# Patient Record
Sex: Male | Born: 1955 | Race: White | Hispanic: No | Marital: Married | Smoking: Former smoker
Health system: Southern US, Community
[De-identification: ages and names within clinical notes are randomized; demographics above are authoritative.]

## PROBLEM LIST (undated history)

## (undated) DIAGNOSIS — N529 Male erectile dysfunction, unspecified: Secondary | ICD-10-CM

## (undated) DIAGNOSIS — I10 Essential (primary) hypertension: Secondary | ICD-10-CM

## (undated) DIAGNOSIS — E785 Hyperlipidemia, unspecified: Secondary | ICD-10-CM

## (undated) DIAGNOSIS — I2510 Atherosclerotic heart disease of native coronary artery without angina pectoris: Secondary | ICD-10-CM

## (undated) HISTORY — DX: Atherosclerotic heart disease of native coronary artery without angina pectoris: I25.10

## (undated) HISTORY — DX: Male erectile dysfunction, unspecified: N52.9

## (undated) HISTORY — DX: Essential (primary) hypertension: I10

## (undated) HISTORY — PX: FINGER AMPUTATION: SHX636

## (undated) HISTORY — DX: Hyperlipidemia, unspecified: E78.5

---

## 2002-01-26 HISTORY — PX: CARDIAC CATHETERIZATION: SHX172

## 2002-02-23 ENCOUNTER — Inpatient Hospital Stay (HOSPITAL_COMMUNITY): Admission: AD | Admit: 2002-02-23 | Discharge: 2002-02-26 | Payer: Self-pay

## 2005-07-14 ENCOUNTER — Encounter: Admission: RE | Admit: 2005-07-14 | Discharge: 2005-07-14 | Payer: Self-pay | Admitting: Family Medicine

## 2005-12-26 HISTORY — PX: NM MYOCAR PERF WALL MOTION: HXRAD629

## 2008-06-13 ENCOUNTER — Encounter: Admission: RE | Admit: 2008-06-13 | Discharge: 2008-06-13 | Payer: Self-pay | Admitting: Cardiology

## 2008-06-21 ENCOUNTER — Observation Stay (HOSPITAL_COMMUNITY): Admission: RE | Admit: 2008-06-21 | Discharge: 2008-06-22 | Payer: Self-pay | Admitting: Cardiology

## 2008-06-21 HISTORY — PX: CARDIAC CATHETERIZATION: SHX172

## 2010-05-06 LAB — CARDIAC PANEL(CRET KIN+CKTOT+MB+TROPI)
CK, MB: 21.3 ng/mL — ABNORMAL HIGH (ref 0.3–4.0)
Relative Index: 13.1 — ABNORMAL HIGH (ref 0.0–2.5)
Total CK: 141 U/L (ref 7–232)
Total CK: 162 U/L (ref 7–232)
Troponin I: 1.38 ng/mL (ref 0.00–0.06)
Troponin I: 1.71 ng/mL (ref 0.00–0.06)

## 2010-05-06 LAB — CBC
HCT: 43.9 % (ref 39.0–52.0)
Hemoglobin: 15.1 g/dL (ref 13.0–17.0)
MCHC: 34.4 g/dL (ref 30.0–36.0)
MCV: 86.1 fL (ref 78.0–100.0)
Platelets: 268 10*3/uL (ref 150–400)
RBC: 5.1 MIL/uL (ref 4.22–5.81)
RDW: 13 % (ref 11.5–15.5)
WBC: 9.4 10*3/uL (ref 4.0–10.5)

## 2010-05-06 LAB — BASIC METABOLIC PANEL
BUN: 11 mg/dL (ref 6–23)
CO2: 29 mEq/L (ref 19–32)
Calcium: 8.9 mg/dL (ref 8.4–10.5)
Chloride: 107 mEq/L (ref 96–112)
Creatinine, Ser: 0.94 mg/dL (ref 0.4–1.5)
GFR calc Af Amer: >60 mL/min (ref >60)
GFR calc non Af Amer: >60 mL/min (ref >60)
Glucose, Bld: 116 mg/dL — ABNORMAL HIGH (ref 70–99)
Potassium: 4.2 mEq/L (ref 3.5–5.1)
Sodium: 139 mEq/L (ref 135–145)

## 2010-06-10 NOTE — Cardiovascular Report (Signed)
Justin Dennis, Justin Dennis                  ACCOUNT NO.:  [id number]   MEDICAL RECORD NO.:  [id number]          PATIENT TYPE:  INP   LOCATION:  2507                         FACILITY:  [facility]   PHYSICIAN:  [name], M.D. DATE OF BIRTH:  May 17, 1955   DATE OF PROCEDURE:  06/21/2008  DATE OF DISCHARGE:                            CARDIAC CATHETERIZATION   INDICATIONS:  This 55 year old male presented with increasing symptoms  of dyspnea on exertion and some vague chest and upper back pain.  He had  had a stent placed to his circumflex in 2004.  It was a 3.0 x 18 Cypher  stent.  Because of his symptoms, a decision was made to bring him to the  Cath Lab for an outpatient cardiac catheterization.   After obtaining informed consent, the patient was prepped and draped in  the usual sterile fashion exposing the right groin.  Following local  anesthetic with 1% Xylocaine, the Seldinger technique was deployed and a  5-French introducer sheath was placed in the right femoral artery.  Left  and right coronary arteriography and ventriculography was performed.  Following this intervention to the RCA and then intervention to the  circumflex was performed.   COMPLICATIONS:  None.   EQUIPMENT:  The diagnostic catheters were 5-French Judkins  configurations.   Interventional equipment as listed below.   TOTAL CONTRAST:  300 mL   MEDICATIONS:  The patient was given 4 baby aspirins prior to starting  the cath, 600 mg of oral Plavix after the procedure.  He was given IV  Angiomax during the procedure and had an ACT of 296.   RESULTS:  1. Hemodynamic monitoring.  His central aortic pressure was 135/86.      His left ventricular pressure was 132/10 and there was no aortic      valve gradient at the time of pullback.  2. Ventriculography.  Ventriculography in the RAO projection using 20      mL of contrast at 12 mL per second revealed an ejection fraction of      55%, no wall motion abnormalities and  end-diastolic pressure of 15.   CORONARY ARTERIOGRAPHY:  1. Left main normal and bifurcated.  2. Circumflex.  There was a stent in the proximal portion of the      circumflex with an area of 70% hyperlucent in the body of the      stent.  The distal circumflex and the OMs were free of disease.  3. LAD.  The LAD crossed the apex of the heart that had minimal      proximal irregularities.  There was a moderate size first diagonal      that was free of disease.   Right coronary artery.  There was sequential areas of 80 and 70%  narrowing in the proximal right coronary artery.  The mid distal PDA and  two posterior lateral vessels were all free of disease.   Because of the above-mentioned stenoses, the decision was made to  proceed on with intervention.  The 5-French system was upgraded to a 6-  [place] system.  The first intervention involved the right coronary  artery, a JR-4 with side hole guide catheter and a Prowater wire was  used.  The guide catheter was difficult to cannulate the right coronary  artery.  Once it cannulated it deep throated itself almost to the  proximal lesion.  There were several occasions where we had to  reposition the guide catheter and in between stent deployment and post  dilatation I lost the entire system and had to re-engage the ostium and  then had to prolapse my wire into the distal right coronary artery.  This was actually accomplished with no adverse sequelae.   Once the Prowater wire was placed in the distal right coronary artery  and the ACT from the Angiomax was greater than 200.  A 2.0 x 12 apex  balloon was used to dilate the 80% most proximal lesion.  After single  inflation of 10 atmospheres, the area was now less than 50% narrowed.   A 3.0 x 18 Cypher stent was then made ready and placed in the distal  area of obstruction.  This extended back towards the proximal area.  The  stent was deployed at 14 atmospheres for 42 seconds with a final   inflation being 14 atmospheres for 30 seconds.   The next stent used was an Xience 3.0 x 15 stent.  This was placed in  such a manner that it overlapped the Cypher stent and covered the  proximal area quite well.  The initial deployment was 14 atmospheres for  40 seconds and the final inflation was 14 atmospheres for 40 seconds.   At this point, we lost guide backup and wire position.  We were able to  finally recannulated and prolapse the wire down to the distal right.   Post dilatation was accomplished with an Nash Quantum balloon 3.25 x 20.  A total of three inflations throughout the entire stented area but also  making sure that the overlap was well covered.  15 atmospheres for 40  seconds was a most aggressive inflation.  Following the intervention,  there was no evidence of any dissection or thrombus formation.  The  vessel appeared to be normal.  There was brisk TIMI III flow.  The  patient did complain of some intermittent chest discomfort with the  inflation and he was given intracoronary nitroglycerin.   At this point, the attention was then turned to the in-stent restenosis  of the circumflex.  At [facility] 3.5 guide catheter and a Prowater wire was  used.  Once the wire was in the distal OM system, a cutting balloon 3.25  x 15 was placed within the stent.  A total of three inflations 8 x 48, 8  x 40 and 8 x 30 were performed.  The area that had initially been 70%  hyperlucent preintervention now appeared to be completely normal with no  evidence of any dissection or thrombus formation.   The patient should be ready for discharge in the morning.  He will need  Plavix 75 mg for a total of 24 months.   He had been on metoprolol at home but complained of profound tiredness  and fatigue.  Prior to that, the patient had been on metoprolol at home  and complained of side effects of tiredness and fatigue.  Prior to that  he had been on Toprol 25 mg with no problems.  We will change the   metoprolol back to Toprol.  I will reevaluate him in about  3 weeks in  the office.           ______________________________  [name], M.D.     ABL/MEDQ  D:  06/21/2008  T:  06/22/2008  Job:  [id number]   cc:   Ursula Beath, MD  Cath Lab

## 2010-06-10 NOTE — Discharge Summary (Signed)
NAMESHAHRAM, ALEXOPOULOS                  ACCOUNT NO.:  [id number]   MEDICAL RECORD NO.:  [id number]          PATIENT TYPE:  INP   LOCATION:  2507                         FACILITY:  [facility]   PHYSICIAN:  [name], M.D. DATE OF BIRTH:  1955-02-11   DATE OF ADMISSION:  06/21/2008  DATE OF DISCHARGE:  06/22/2008                               DISCHARGE SUMMARY   DISCHARGE DIAGNOSES:  1. Unstable angina.  2. Progressive coronary artery disease status post cardiac      catheterization revealing sequential areas of 80% and 70% in      proximal right coronary artery and also circumflex proximal 70%      area of narrowing within the body of the stent.  He went on to      undergo percutaneous transluminal coronary angioplasty of his right      coronary artery and a Cypher stent placed 3.0 x 18, also a XIENCE      3.0 x 15 overlapping with the Cypher stent.  He had a percutaneous      coronary intervention to his in-stent restenosis of the circumflex      using cutting balloon.  He did have a CK-MB and troponin elevation      postprocedure with peak CK-MB of 162/21 and troponin of 1.71.  3. Hyperlipidemia.  4. Erectile dysfunction.   LABORATORY DATA:  Sodium 139, potassium 4.2, chloride 107, CO2 29,  glucose 116, BUN 11, creatinine 0.94.  Hemoglobin 15.1, hematocrit 43.9,  WBCs 9.1, platelets 268.  CK-MB:  1. 162/21 with a troponin of 1.71.  2. 141/16 with a troponin of 1.38.   Recent chest x-ray, Jun 13, 2008, in the computer shows low lung volumes  with a minimal bibasilar atelectasis.   DISCHARGE MEDICATIONS:  1. Zocor 40 mg daily.  2. Aspirin 325 mg a day.  3. Toprol-XL 25 mg daily.  4. Plavix 75 mg daily.  5. Nitroglycerin 0.4 mg p.r.n. as needed.   HOSPITAL COURSE:  Mr. Truex is a 55 year old white married male patient  of Dr. Julieanne Manson.  He was seen in the office on Jun 05, 2008.  He  was experiencing some increased tiredness, shortness of breath, and  fatigue.  It was  decided that he should undergo cardiac catheterization  because he has known coronary artery disease.  Options have been  discussed with him, and he opted for cardiac cath.  This was performed  by Dr. Julieanne Manson on Jun 21, 2008.  He was found to have progressed  disease with in-stent restenosis of circumflex stent and also some new  lesions of his RCA.  He went on to have 2 stents, a Cypher and a XIENCE,  to his RCA and he had cutting balloon angioplasty to his circumflex  stent.  He did have some a bump in his troponins and CPK-MBs; however,  he was asymptomatic.  His EKG was unchanged.  He walked in the halls.  His blood pressure was  stable.  He had no chest pain, and he was considered stable for  discharge home.  He was seen by cardiac rehab.  The patient declined  cardiac rehab phase II as he could not do this because of his job, he  stated.      Lezlie Octave, N.P.    ______________________________  [name], M.D.    BB/MEDQ  D:  06/22/2008  T:  06/23/2008  Job:  [id number]   cc:   Ursula Beath, MD

## 2010-06-13 NOTE — Cardiovascular Report (Signed)
Justin Dennis, Justin Dennis                            ACCOUNT NO.:  [id number]   MEDICAL RECORD NO.:  [id number]                   PATIENT TYPE:  INP   LOCATION:  3706                                 FACILITY:  [facility]   PHYSICIAN:  [name], M.D.              DATE OF BIRTH:  02-24-1955   DATE OF PROCEDURE:  02/24/2002  DATE OF DISCHARGE:                              CARDIAC CATHETERIZATION   INDICATIONS FOR TEST:  The patient is a 55 year old male who has had new  onset of angina over the last three days; yesterday, a more severe episode  of angina. He was admitted to the hospital with a normal ECG.  His cardiac  enzymes came back later positive with both MB and troponin and he has been  pain-free.  His ECG shows new T wave inversion in the lateral leads 24 hours  later.   DESCRIPTION OF PROCEDURE:  The patient was prepped and draped in the usual  sterile fashion exposing the right groin. Following local anesthetic with 1%  Xylocaine, the Seldinger technique was employed and a 5 [place] introducer  sheath was placed into the right femoral artery. Left and right coronary  arteriography and ventriculography in the RAO projection was performed.   RESULTS:  1. Hemodynamic monitoring:  Central aortic pressure was 94/68, left     ventricular pressure was 91/10 and there was no significant aortic valve     gradient noted at time of pullback.  2. Ventriculography:  Ventriculography in both the RAO and LAO projection     was performed. The ejection fraction was greater than 50%.  The end-     diastolic pressure was only 10.  The lateral wall was hypokinetic,     particularly in the inferior portion of the lateral wall.   CORONARY ARTERIOGRAPHY:  1. Left main:  Normal.  2. LAD:  The LAD had mild proximal irregularities.  The first diagonal was     free of disease.  3. Circumflex:  The circumflex gave rise to one large OM.  At the     bifurcation of the OM from the ongoing circumflex,  there was an eccentric     90% area of narrowing that involves the ostium of the OM and the ongoing     circumflex. There was mild proximal and distal areas of dilatation at the     lesion.  The distal OM was free of disease.  4. RCA:  Dominant vessel with mild proximal irregularities.   In view of the high-grade stenosis in the circumflex, arrangements were made  for intervention.  The patient had already been started on Integrilin when  his cardiac enzymes came back positive. He was given IV heparin and 300 mg  of oral Plavix.   A 5 French sheath was exchanged for a 6 [place] sheath.  A JL4 6 [place] guide  catheter was used.  A short luge wire was placed down the circumflex and  well distal to the OM.  At this point, a 2.5 x 10 CrossSail balloon was  placed and a single inflation of 8 atmospheres for 66 seconds was performed.  This pre-dilatation resulted in reduction of the narrowing to less than 50%.   A 3.0 x 18 Cypher stent was then made ready and placed in such a manner that  both the proximal and distal OMs of the area of narrowing were well covered.  It was deployed at 12 atmospheres for 60 seconds with the final inflation  being 15 atmospheres for 60 seconds. The stented area appeared to be normal  after inflation, but there was mild irregularities distal to the stent as  noted prior to the catheterization.  The ostium of the ongoing circumflex,  however, was now 90% narrowed.  The same short luge wire was then passed  through the stent into the ongoing circumflex.  Attempts at using the 2.5 x  10 CrossSail were unsuccessful, so a 2.5 x 15 CrossSail balloon was used to  pass easily through the struts of the stent and a single inflation of 9  atmospheres for 53 seconds was performed.  The ostium, which had been 90%  narrowed pre angioplasty is now less than 30% narrowed. The stent did not  appear to be affected.  There was brisk TIMI-3 flow.  This was a moderate  complexity  lesion.  The patient should be ready for discharge in 48 hours.  He will be kept an extra day because he did have an acute myocardial  infarction. I will maintain him on Integrilin for an additional 18 hours.                                                  [name], M.D.    ABL/MEDQ  D:  02/24/2002  T:  02/24/2002  Job:  [id number]

## 2010-06-13 NOTE — Discharge Summary (Signed)
Justin Dennis, Justin Dennis                            ACCOUNT NO.:  [id number]   MEDICAL RECORD NO.:  [id number]                   PATIENT TYPE:  INP   LOCATION:  4729                                 FACILITY:  [facility]   PHYSICIAN:  [name], M.D.              DATE OF BIRTH:  03/20/1955   DATE OF ADMISSION:  02/23/2002  DATE OF DISCHARGE:  02/26/2002                                 DISCHARGE SUMMARY   ADMISSION DIAGNOSES:  1. Unstable angina.  2. Tobacco abuse.  3. Hyperlipidemia.   DISCHARGE DIAGNOSES:  1. Coronary artery disease.     a. Mild Q-wave myocardial infarction.     b. Status post percutaneous transluminal coronary angioplasty and stent        placement to the circumflex.  2. Tobacco abuse.  3. Hyperlipidemia.   PROCEDURE:  Cardiac catheterization February 24, 2002.   COMPLICATIONS:  None.   DISCHARGE STATUS:  Stable.   ADMISSION HISTORY:  This is a 55 year old male with no prior cardiac  history.  Only history includes tobacco abuse and hyperlipidemia.  He  presented to [facility] ER with a history two days prior to this admission with  what he thought was heartburn.  He used Mylanta and eventually gained  relief.  The day prior to this admission, still had minimal epigastric low-  anterior chest discomfort; however, not enough for him to do anything about  it.  On the day of admission, on his way to work he began experiencing some  chest discomfort.  By the time he got inside his building at work, the pain  increased.  He then developed chest pressure, nausea, diaphoresis, and mild  shortness of breath.  He also had left arm numbness.  He continued to remain  at work and said that he felt that symptoms seemed to improve somewhat  throughout the day.  He presented to [facility] for [facility] evaluation.  He did  relate, however, that he was shoveling snow the day prior to this admission  and had to stop secondary to shortness of breath.   PHYSICAL EXAMINATION ON ADMISSION:   VITAL SIGNS:  Blood pressure 125/80,  heart rate 95.  He was afebrile.  O2 saturation 98% on room air.  GENERAL:  He was conscious and alert, well oriented.  Skin warm and dry.  No  acute distress.  HEART:  Regular with no murmur.  LUNGS:  Clear.  ABDOMEN:  Benign.  VASCULAR:  There were no vascular bruits.  No peripheral edema.   LABORATORY DATA:  EKG was a normal sinus rhythm with no significant ischemic  changes.  Mild ST elevation in V2 and V3 of only 1 mm.   Admission labs:  Normal CBC and BMP.  Cardiac enzymes showed a total CK of  560 with 66 MB and a troponin of 8.82.   Chest x-ray showed borderline cardiomegaly.   Fasting  lipid profile showed a total of 186, triglycerides 119, HDL 38, LDL  124.   HOSPITAL COURSE:  The patient was admitted with acute non-Q wave MI.  He was  started on IV heparin, aspirin, beta-blocker, and plans for cardiac  catheterization.   The patient was taken to the cardiac catheterization lab on February 24, 2002.  Results revealed normal left main.  LAD with only mild proximal  irregularity.  Diagonal was free of disease.  RCA had only mild  irregularity.  Circumflex had one large OM.  At the bifurcation of the OM  from the circumflex was an eccentric area of 90%.  Normal systolic function  with an EF of 50%.   He underwent PTCA and Cypher stent placement to the ostium of the ongoing  circumflex.  He was started on p.o. Plavix at that time.   He remained chest pain free for the rest of his admission.  Vital signs  remained stable.  No further EKG change.  He was discharge home on February 26, 2002 without incident.   DISCHARGE MEDICATIONS:  1. Aspirin 325 mg daily.  2. Toprol XL 25 mg daily.  3. Zocor 20 mg a day.  4. Plavix 75 mg daily.   ACTIVITY:  He is not to undertake any strenuous physical activity for the  next 10-14 days.  He may gradually increase his activity at that time.   DIET:  He is to maintain a low-fat/low-cholesterol  diet.   DISCHARGE INSTRUCTIONS:  He needs to stop smoking.   FOLLOW UP:  He is to follow up with Dr. Caprice Kluver in 2-3 weeks after  discharge.  He is to call for an appointment.     Justin Dennis, N.P.                        [name], M.D.    HB/MEDQ  D:  04/26/2002  T:  04/27/2002  Job:  [id number]

## 2010-08-11 IMAGING — CR DG CHEST 2V
2 series · 2 of 2 positions shown · non-contrast
Comparison: None

CLINICAL DATA: Shortness of breath and chest pain.  Preop cardiac
catheterization.

CHEST - 2 VIEW

[view not recorded (1 of 2)]
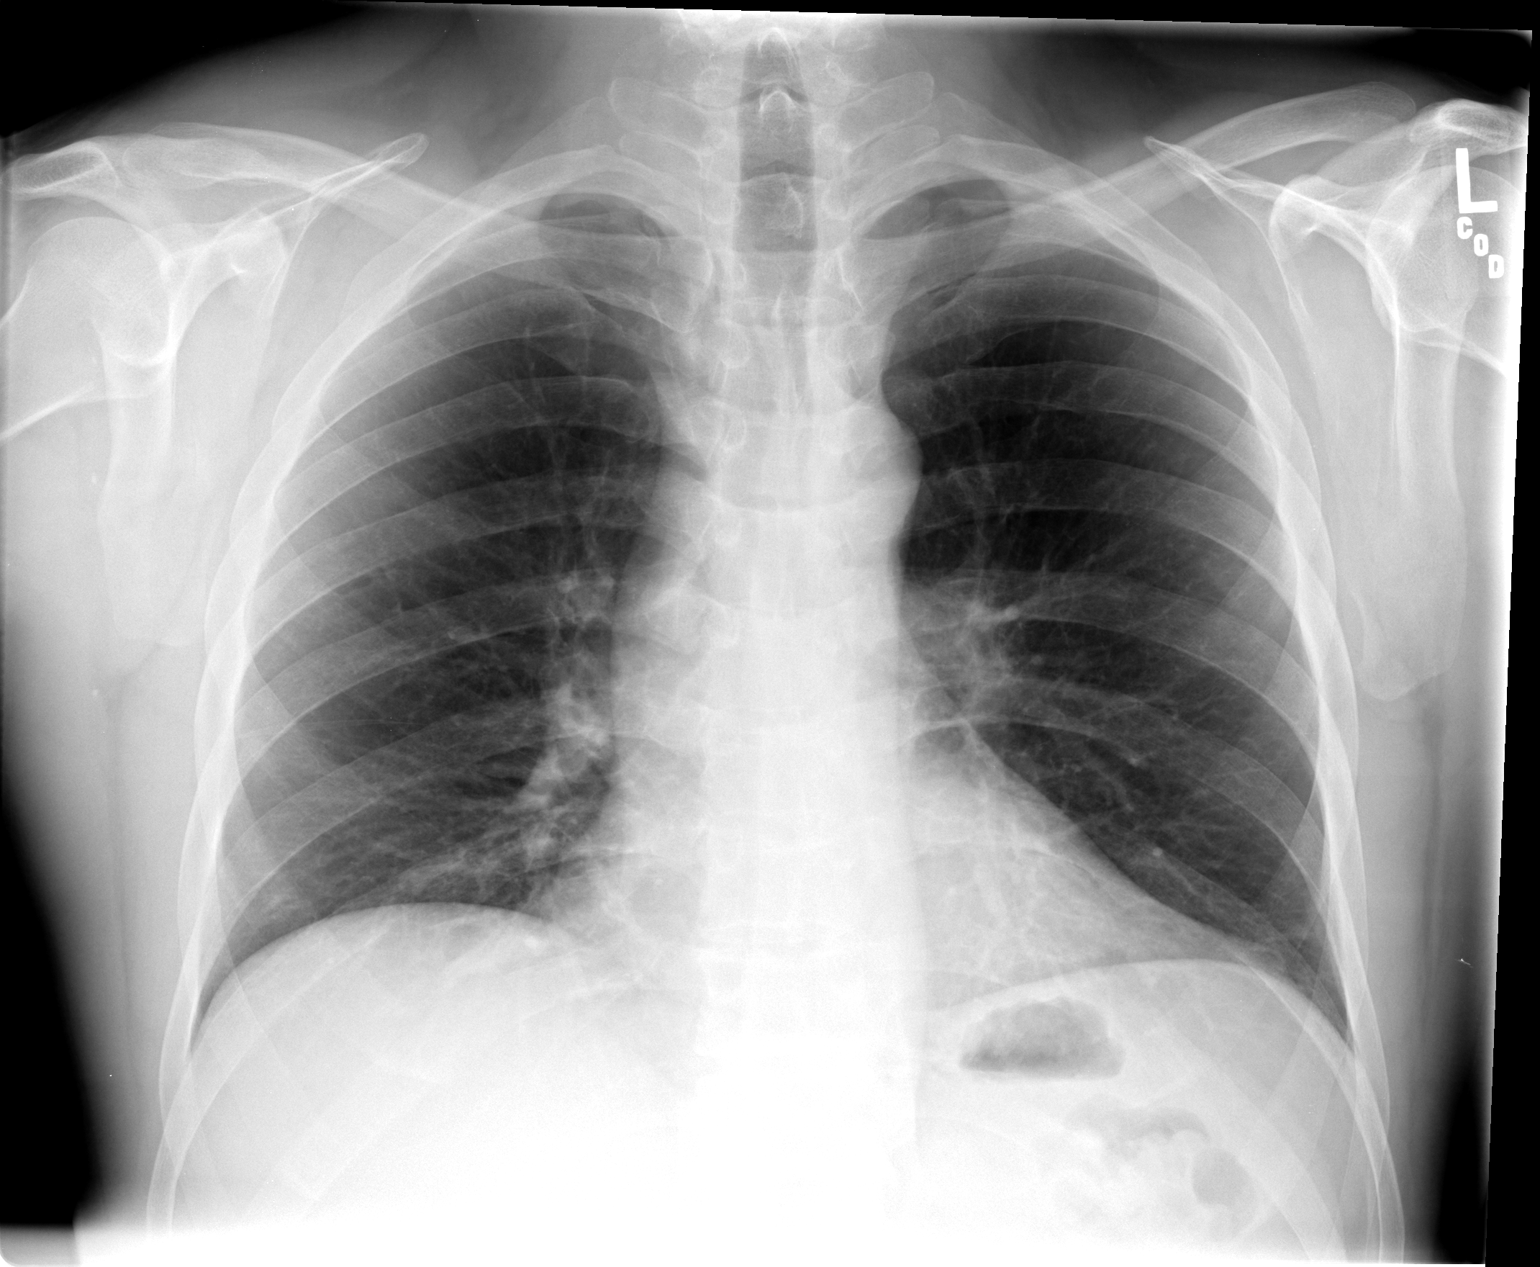

[view not recorded (2 of 2)]
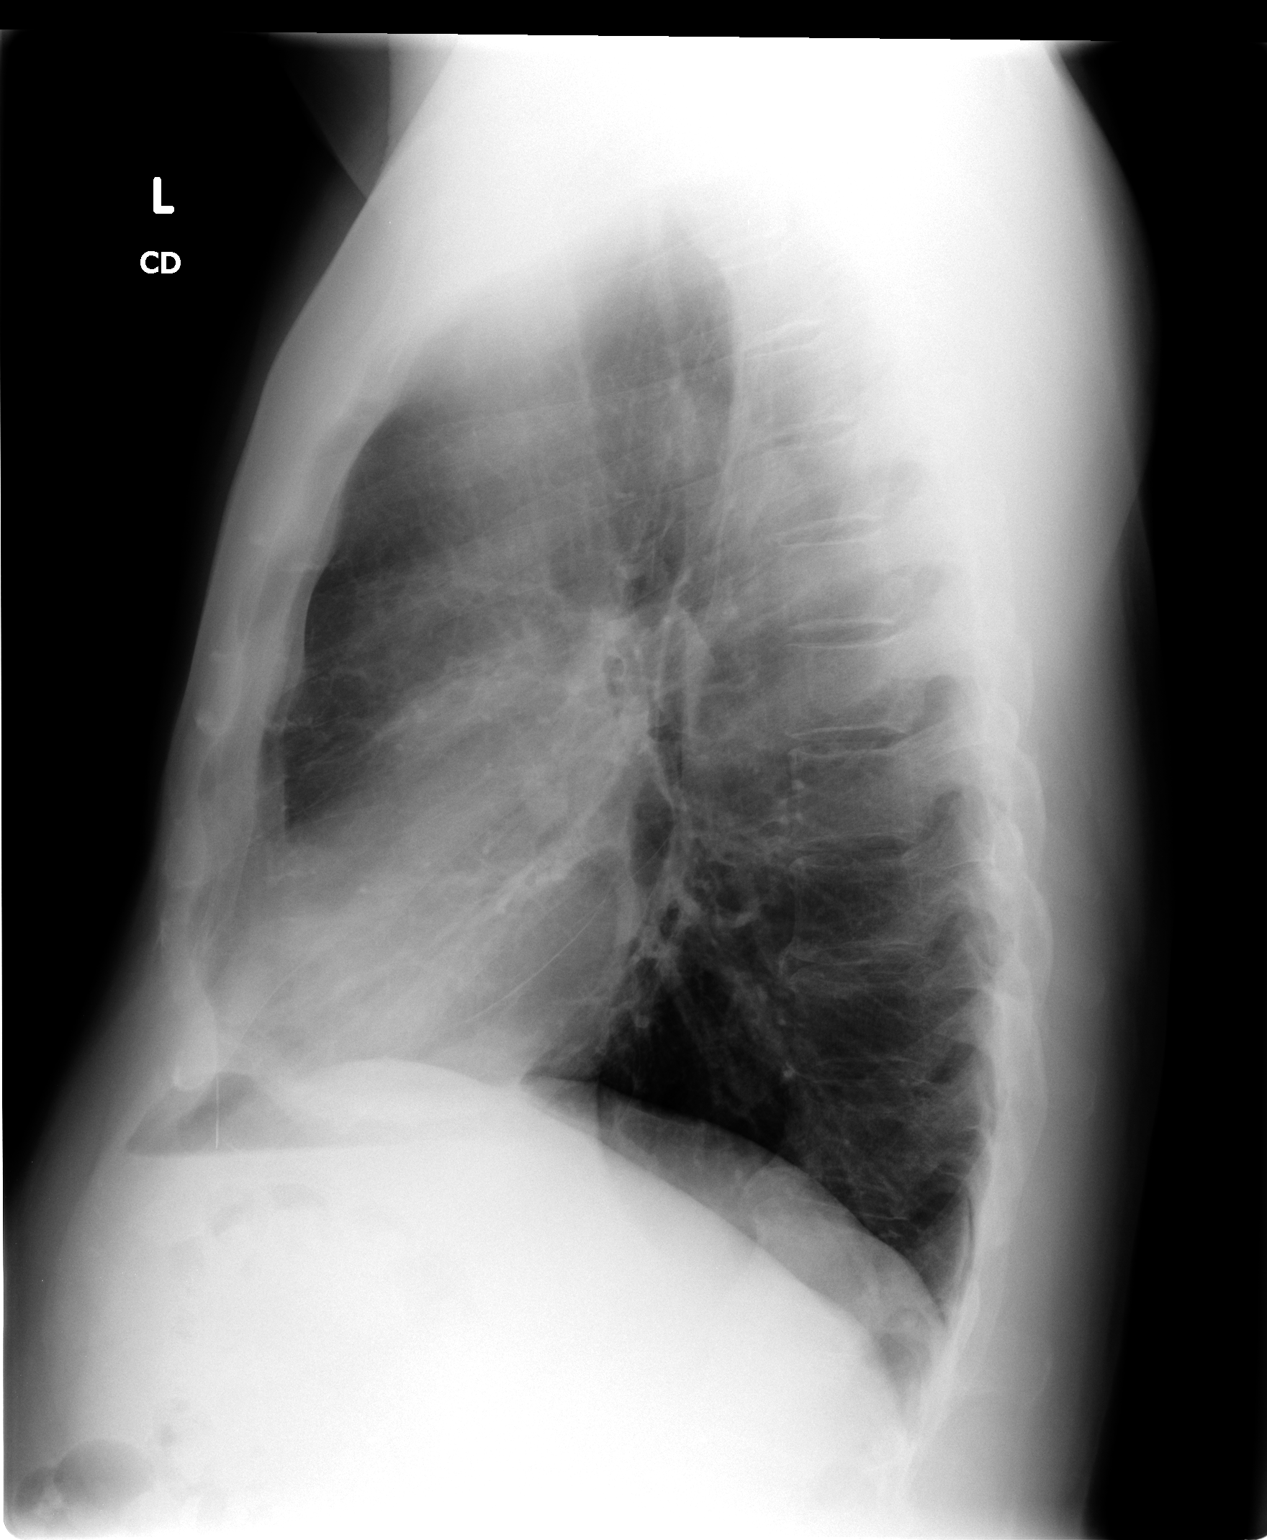

[2 of 2 positions shown; findings below may reference images not displayed]

FINDINGS: Trachea is midline.  Heart size is accentuated by low
lung volumes.  Minimal bibasilar atelectasis.  Lungs are otherwise
clear.  No pleural fluid.
IMPRESSION: Low lung volumes with minimal bibasilar atelectasis.

## 2012-08-02 ENCOUNTER — Other Ambulatory Visit (HOSPITAL_COMMUNITY): Payer: Self-pay | Admitting: Cardiovascular Disease

## 2012-08-02 ENCOUNTER — Telehealth (HOSPITAL_COMMUNITY): Payer: Self-pay | Admitting: Cardiovascular Disease

## 2012-08-02 DIAGNOSIS — I714 Abdominal aortic aneurysm, without rupture: Secondary | ICD-10-CM

## 2012-08-02 DIAGNOSIS — I25810 Atherosclerosis of coronary artery bypass graft(s) without angina pectoris: Secondary | ICD-10-CM

## 2012-08-22 ENCOUNTER — Telehealth (HOSPITAL_COMMUNITY): Payer: Self-pay | Admitting: Cardiovascular Disease

## 2012-08-25 ENCOUNTER — Telehealth (HOSPITAL_COMMUNITY): Payer: Self-pay | Admitting: Cardiovascular Disease

## 2012-09-01 ENCOUNTER — Telehealth (HOSPITAL_COMMUNITY): Payer: Self-pay | Admitting: Cardiovascular Disease

## 2012-09-12 ENCOUNTER — Telehealth (HOSPITAL_COMMUNITY): Payer: Self-pay | Admitting: Cardiovascular Disease

## 2012-09-30 ENCOUNTER — Encounter (HOSPITAL_COMMUNITY): Payer: Self-pay | Admitting: *Deleted

## 2012-09-30 ENCOUNTER — Telehealth (HOSPITAL_COMMUNITY): Payer: Self-pay | Admitting: *Deleted

## 2012-09-30 NOTE — Telephone Encounter (Signed)
Justin Dennis CARDIOVASCULAR IMAGING LOCATED AT [facility] [address] [place], [place] [address] [phone]   Date:09/30/2012  Dear Dr. Alanda Amass  Our office has attempted to contact your patient twice by telephone and we have also sent an appointment letter to schedule the Stress Test,Carotid Doppler and Aorta Doppler test you ordered. The patient has not responded. We will not make any further attempts to contact the patient. If any further assistance is needed for this referral, please contact our office at [phone]  Sincerely,

## 2012-12-01 ENCOUNTER — Telehealth (HOSPITAL_COMMUNITY): Payer: Self-pay | Admitting: *Deleted

## 2012-12-07 ENCOUNTER — Ambulatory Visit (HOSPITAL_COMMUNITY)
Admission: RE | Admit: 2012-12-07 | Discharge: 2012-12-07 | Disposition: A | Discharge status: Home / Self Care | Payer: Managed Care, Other (non HMO) | Source: Ambulatory Visit | Attending: Cardiovascular Disease | Admitting: Cardiovascular Disease

## 2012-12-07 DIAGNOSIS — I25810 Atherosclerosis of coronary artery bypass graft(s) without angina pectoris: Secondary | ICD-10-CM | POA: Insufficient documentation

## 2012-12-07 MED ORDER — TECHNETIUM TC 99M SESTAMIBI GENERIC - CARDIOLITE
10.0000 | Freq: Once | INTRAVENOUS | Status: AC | PRN
  Administered 2012-12-07: 10 via INTRAVENOUS

## 2012-12-07 MED ORDER — TECHNETIUM TC 99M SESTAMIBI GENERIC - CARDIOLITE
30.0000 | Freq: Once | INTRAVENOUS | Status: AC | PRN
  Administered 2012-12-07: 30 via INTRAVENOUS

## 2012-12-07 NOTE — Procedures (Addendum)
[place] CARDIOVASCULAR IMAGING [address] [place] [address] [place] [address] [phone]  Cardiology Nuclear Med Study  Justin Dennis is a 57 y.o. male     MRN : [id number]     DOB: 1955-11-12  Procedure Date: 12/07/2012  Nuclear Med Background Indication for Stress Test:  Evaluation for Ischemia and Stent Patency History:  CAD;MI;STENT/PTCA2004 AND 05/2008;ARTHRECTOMY--2010 Cardiac Risk Factors: Family History - CAD, History of Smoking, Hypertension and Lipids  Symptoms:  DOE and Fatigue   Nuclear Pre-Procedure Caffeine/Decaff Intake:  7:00pm NPO After: 5:00am   IV Site: R Hand  IV 0.9% NS with Angio Cath:  22g  Chest Size (in):  42"  IV Started by: Emmit Pomfret, RN  Height: 5\' 11"  (1.803 m)  Cup Size: n/a  BMI:  Body mass index is 19.26 kg/(m^2). Weight:  138 lb (62.596 kg)   Tech Comments:  N/A    Nuclear Med Study 1 or 2 day study: 1 day  Stress Test Type:  Stress  Order Authorizing Provider:  Susa Griffins, MD   Resting Radionuclide: Technetium 87m Sestamibi  Resting Radionuclide Dose: 10.5 mCi   Stress Radionuclide:  Technetium 84m Sestamibi  Stress Radionuclide Dose: 31.0 mCi           Stress Protocol Rest HR: 84 Stress HR: 162  Rest BP: 111/91 Stress BP: 188/92  Exercise Time (min): 8 METS: 10.1   Predicted Max HR: 163 bpm % Max HR: 99.39 bpm Rate Pressure Product: 25366  Dose of Adenosine (mg):  n/a Dose of Lexiscan: n/a mg  Dose of Atropine (mg): n/a Dose of Dobutamine: n/a mcg/kg/min (at max HR)  Stress Test Technologist: Esperanza Sheets, CCT Nuclear Technologist: Koren Shiver, CNMT   Rest Procedure:  Myocardial perfusion imaging was performed at rest 45 minutes following the intravenous administration of Technetium 58m Sestamibi. Stress Procedure:  The patient performed treadmill exercise using a Bruce  Protocol for 8 minutes. The patient stopped due to SOB and Fatigue and denied any chest pain.  There were no  significant ST-T wave changes.  Technetium 55m Sestamibi was injected at peak exercise and myocardial perfusion imaging was performed after a brief delay.  Transient Ischemic Dilatation (Normal <1.22):  0.76 Lung/Heart Ratio (Normal <0.45):  0.34 QGS EDV:  71 ml QGS ESV:  28 ml LV Ejection Fraction: 60%  Rest ECG: NSR - Normal EKG  Stress ECG: No significant ST segment change suggestive of ischemia.  QPS Raw Data Images:  Normal; no motion artifact; normal heart/lung ratio. Stress Images:  Normal homogeneous uptake in all areas of the myocardium. Rest Images:  There is decreased uptake in the inferior wall. Subtraction (SDS):  No evidence of ischemia.  Impression Exercise Capacity:  Good exercise capacity. BP Response:  Normal blood pressure response. Clinical Symptoms:  Dyspnea, fatigue ECG Impression:  No significant ST segment change suggestive of ischemia. Comparison with Prior Nuclear Study: No significant change from previous study  Overall Impression:  Low risk stress nuclear study mild bowel attenuation artifact. No ischemia.  LV Wall Motion:  NL LV Function; NL Wall Motion; EF 60%  Chrystie Nose, MD, [facility] Board Certified in Nuclear Cardiology Attending Cardiologist [facility]   Chrystie Nose, MD  12/07/2012 1:48 PM

## 2013-01-25 ENCOUNTER — Other Ambulatory Visit: Payer: Self-pay | Admitting: *Deleted

## 2013-01-25 ENCOUNTER — Telehealth: Payer: Self-pay | Admitting: Cardiovascular Disease

## 2013-01-25 MED ORDER — METOPROLOL SUCCINATE ER 25 MG PO TB24: 25.0000 mg | ORAL_TABLET | Freq: Every day | ORAL | Status: DC

## 2013-01-25 NOTE — Telephone Encounter (Signed)
Returned call.  Left message asking that hardy copy request be faxed to [phone] for refills.  

## 2013-01-25 NOTE — Telephone Encounter (Signed)
Need a refill on Metoprolol ER 25 mg #30

## 2013-01-30 ENCOUNTER — Other Ambulatory Visit: Payer: Self-pay | Admitting: *Deleted

## 2013-01-30 MED ORDER — METOPROLOL SUCCINATE ER 25 MG PO TB24: 25.0000 mg | ORAL_TABLET | Freq: Every day | ORAL | Status: DC

## 2013-01-31 ENCOUNTER — Encounter: Payer: Self-pay | Admitting: *Deleted

## 2013-01-31 ENCOUNTER — Other Ambulatory Visit: Payer: Self-pay | Admitting: *Deleted

## 2013-01-31 MED ORDER — METOPROLOL SUCCINATE ER 25 MG PO TB24: 25.0000 mg | ORAL_TABLET | Freq: Every day | ORAL | Status: DC

## 2013-01-31 NOTE — Telephone Encounter (Signed)
Rx was sent to pharmacy electronically. 

## 2013-02-09 ENCOUNTER — Encounter: Payer: Self-pay | Admitting: Internal Medicine

## 2013-02-10 ENCOUNTER — Ambulatory Visit (INDEPENDENT_AMBULATORY_CARE_PROVIDER_SITE_OTHER): Payer: Managed Care, Other (non HMO) | Admitting: Internal Medicine

## 2013-02-10 ENCOUNTER — Encounter: Payer: Self-pay | Admitting: Internal Medicine

## 2013-02-10 VITALS — BP 136/80 | HR 73 | Ht 72.0 in | Wt 196.9 lb

## 2013-02-10 DIAGNOSIS — I10 Essential (primary) hypertension: Secondary | ICD-10-CM | POA: Insufficient documentation

## 2013-02-10 DIAGNOSIS — I2129 ST elevation (STEMI) myocardial infarction involving other sites: Secondary | ICD-10-CM

## 2013-02-10 DIAGNOSIS — E785 Hyperlipidemia, unspecified: Secondary | ICD-10-CM

## 2013-02-10 DIAGNOSIS — N529 Male erectile dysfunction, unspecified: Secondary | ICD-10-CM

## 2013-02-10 DIAGNOSIS — I2510 Atherosclerotic heart disease of native coronary artery without angina pectoris: Secondary | ICD-10-CM

## 2013-02-10 MED ORDER — SILDENAFIL CITRATE 50 MG PO TABS: 50.0000 mg | ORAL_TABLET | Freq: Every day | ORAL | Status: DC | PRN

## 2013-02-10 MED ORDER — METOPROLOL SUCCINATE ER 25 MG PO TB24: 25.0000 mg | ORAL_TABLET | Freq: Every day | ORAL | Status: DC

## 2013-02-10 NOTE — Progress Notes (Signed)
OFFICE NOTE  Chief Complaint:  Routine follow-up  Primary Care Physician: [name] Per Patient  HPI:  Justin Dennis is a pleasant 58 year old male with a significant history of heart disease. In 2004 he had an MI on his way to work. When he reached the office he actually took 3 aspirin and unfortunately continued to work throughout the day. When he got home he told his wife that he probably had a heart attack and she recommended he go see the doctor immediately. He was subsequently hospitalized the catheterization revealed a posterior MI. He had a Cypher stent put in the circumflex. ENT doesn't 10 he had recurrent coronary disease and received overlapping drug-eluting stents in the right coronary artery. The circumflex artery was noted to be restenotic and he had a cutting balloon atherectomy at that time. Since then he done fairly well. He is being medically managed and recently had a stress test in November of this past year which was negative for ischemia. He denies any further chest pain or shortness of breath. He also has dyslipidemia and a history of smoking in the past but stopped when he had his MI. In addition he has psoriasis and is currently on Humira and the clobetasol shampoo.  PMHx:  Past Medical History  Diagnosis Date  . CAD (coronary artery disease)   . Hyperlipidemia   . Hypertension   . ED (erectile dysfunction)     Past Surgical History  Procedure Laterality Date  . Cardiac catheterization  2004    PMI, Cfx Cypher 3.0x618mm DES - Dr. Mervyn SkeetersA. Little   . Cardiac catheterization  06/21/2008    Cypher stent (3.0x3418mm) overlapping with XIENCFE 3.0x2515mm DES to RCA, in-stent re-stenosis of Cfx (3.125mm cutting balloon arthrectomy) - Dr. Mervyn SkeetersA. Little   . Finger amputation      right finger  . Nm myocar perf wall motion  12/2005    bruce myoview; post-stress with normal pattern of perfusion, EF 56%, low risk     FAMHx:  Family History  Problem Relation Age of Onset  . Stroke  Father   . Skin cancer Brother   . Valvular heart disease Daughter   . Mitral valve prolapse Child   . Diabetes Other     maternal side of family    SOCHx:   reports that he quit smoking about 11 years ago. He has never used smokeless tobacco. He reports that he does not drink alcohol or use illicit drugs.  ALLERGIES:  Allergies  Allergen Reactions  . Niaspan [Niacin Er]     Severe hot flashes    ROS: A comprehensive review of systems was negative except for: Integument/breast: positive for psoriasis  HOME MEDS: Current Outpatient Prescriptions  Medication Sig Dispense Refill  . aspirin 325 MG tablet Take 325 mg by mouth daily.      Marland Kitchen. aspirin 81 MG tablet Take 81 mg by mouth daily. Alternates with 325mg       . betamethasone dipropionate (DIPROLENE) 0.05 % ointment as needed.      . Chlorpheniramine Maleate (ALLERGY RELIEF PO) Take by mouth as needed.      . Clobetasol Propionate 0.05 % shampoo as needed.      Marland Kitchen. HUMIRA PEN 40 MG/0.8ML injection every 14 (fourteen) days.      Marland Kitchen. lisinopril (PRINIVIL,ZESTRIL) 10 MG tablet Take 10 mg by mouth daily.      . metoprolol succinate (TOPROL XL) 25 MG 24 hr tablet Take 1 tablet (25 mg total)  by mouth daily.  30 tablet  11  . Multiple Vitamin (MULTIVITAMIN) capsule Take 1 capsule by mouth daily.      . nitroGLYCERIN (NITROSTAT) 0.4 MG SL tablet Place 0.4 mg under the tongue every 5 (five) minutes as needed for chest pain.      . Omega-3 Fatty Acids (FISH OIL PO) Take 1 capsule by mouth 2 (two) times daily.      . Pseudoephedrine HCl (SUDAFED PO) Take by mouth as needed.      . simvastatin (ZOCOR) 40 MG tablet Take 40 mg by mouth daily.      . sildenafil (VIAGRA) 50 MG tablet Take 1 tablet (50 mg total) by mouth daily as needed for erectile dysfunction.  4 tablet  0   No current facility-administered medications for this visit.    LABS/IMAGING: No results found for this or any previous visit (from the past 48 hour(s)). No results  found.  VITALS: BP 136/80  Pulse 73  Ht 6' (1.829 m)  Wt 196 lb 14.4 oz (89.313 kg)  BMI 26.70 kg/m2  EXAM: General appearance: alert and no distress Neck: no carotid bruit and no JVD Lungs: clear to auscultation bilaterally Heart: regular rate and rhythm, S1, S2 normal, no murmur, click, rub or gallop Abdomen: soft, non-tender; bowel sounds normal; no masses,  no organomegaly Extremities: extremities normal, atraumatic, no cyanosis or edema Pulses: 2+ and symmetric Skin: Multiple psoriatic lesions over the neck and back Neurologic: Grossly normal Psych: Mood, affect normal  EKG: Normal sinus rhythm at 73  ASSESSMENT: 1. Coronary artery disease status post PCI to circumflex in 2004 and to the right coronary artery in 2010 with Cypher stents 2. Hypertension 3. Dyslipidemia 4. Psoriasis 5. Erectile dysfunction  PLAN: 1.   Justin Dennis is doing fairly well without anginal complaints. He recently had a stress test which was negative for ischemia. He had laboratory work done through his primary care provider and will obtain those results as her only a few months old. Plan to continue his current medications. He did asked for samples for Viagra today as he does have some erectile dysfunction. He says he only needs to use that one or 2 times a year. Plan to see him back annually or sooner as necessary.  Justin Nose, MD, [facility] Attending Cardiologist [facility]  Justin Dennis C 02/10/2013, 2:52 PM

## 2013-02-10 NOTE — Patient Instructions (Signed)
Your physician wants you to follow-up in: 1 year. You will receive a reminder letter in the mail two months in advance. If you don't receive a letter, please call our office to schedule the follow-up appointment.  

## 2013-02-24 ENCOUNTER — Other Ambulatory Visit: Payer: Self-pay

## 2013-02-24 MED ORDER — SIMVASTATIN 40 MG PO TABS: 40.0000 mg | ORAL_TABLET | Freq: Every day | ORAL | Status: DC

## 2013-02-24 NOTE — Telephone Encounter (Signed)
Rx was sent to pharmacy electronically. 

## 2013-08-25 ENCOUNTER — Telehealth: Payer: Self-pay | Admitting: Internal Medicine

## 2013-08-25 MED ORDER — SILDENAFIL CITRATE 50 MG PO TABS: 50.0000 mg | ORAL_TABLET | Freq: Every day | ORAL | Status: DC | PRN

## 2013-08-25 NOTE — Telephone Encounter (Signed)
Patient needs refill on Viagra 100 mg # 4.  Please call the pharmacy on file.  Let patient know when this has been done.

## 2013-08-25 NOTE — Telephone Encounter (Signed)
Rx refill sent to patient pharmacy. Patient notified  

## 2013-10-19 ENCOUNTER — Encounter: Payer: Self-pay | Admitting: Internal Medicine

## 2013-10-19 ENCOUNTER — Ambulatory Visit (INDEPENDENT_AMBULATORY_CARE_PROVIDER_SITE_OTHER): Payer: Managed Care, Other (non HMO) | Admitting: Internal Medicine

## 2013-10-19 VITALS — BP 126/88 | HR 70 | Ht 72.0 in | Wt 198.7 lb

## 2013-10-19 DIAGNOSIS — I10 Essential (primary) hypertension: Secondary | ICD-10-CM

## 2013-10-19 DIAGNOSIS — I2510 Atherosclerotic heart disease of native coronary artery without angina pectoris: Secondary | ICD-10-CM

## 2013-10-19 DIAGNOSIS — N528 Other male erectile dysfunction: Secondary | ICD-10-CM

## 2013-10-19 DIAGNOSIS — N529 Male erectile dysfunction, unspecified: Secondary | ICD-10-CM

## 2013-10-19 DIAGNOSIS — E785 Hyperlipidemia, unspecified: Secondary | ICD-10-CM

## 2013-10-19 MED ORDER — SILDENAFIL CITRATE 20 MG PO TABS: 40.0000 mg | ORAL_TABLET | Freq: Every day | ORAL | Status: DC | PRN

## 2013-10-19 NOTE — Progress Notes (Signed)
OFFICE NOTE  Chief Complaint:  Routine follow-up  Primary Care Physician: [name] Per Patient  HPI:  Justin Dennis is a pleasant 58 year old male with a significant history of heart disease. In 2004 he had an MI on his way to work. When he reached the office he actually took 3 aspirin and unfortunately continued to work throughout the day. When he got home he told his wife that he probably had a heart attack and she recommended he go see the doctor immediately. He was subsequently hospitalized the catheterization revealed a posterior MI. He had a Cypher stent put in the circumflex. ENT doesn't 10 he had recurrent coronary disease and received overlapping drug-eluting stents in the right coronary artery. The circumflex artery was noted to be restenotic and he had a cutting balloon atherectomy at that time. Since then he done fairly well. He is being medically managed and recently had a stress test in November of this past year which was negative for ischemia. He denies any further chest pain or shortness of breath. He also has dyslipidemia and a history of smoking in the past but stopped when he had his MI. In addition he has psoriasis and is currently on Humira and the clobetasol shampoo.  Justin Dennis returns early today for followup. He reports that his insurance is changing of his co-pay we'll go up to double the cost next year. He is asymptomatic at this time denies any chest pain or shortness of breath. 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 He is due for lipid profile checked. He is also having continual erectile dysfunction. He has had good success with Viagra however it is too expensive for him at the current dose.  PMHx:  Past Medical History  Diagnosis Date  . CAD (coronary artery disease)   . Hyperlipidemia   . Hypertension   . ED (erectile dysfunction)     Past Surgical History  Procedure Laterality Date  . Cardiac  catheterization  2004    PMI, Cfx Cypher 3.0x43mm DES - Dr. Mervyn Skeeters. Little   . Cardiac catheterization  06/21/2008    Cypher stent (3.0x42mm) overlapping with XIENCFE 3.0x84mm DES to RCA, in-stent re-stenosis of Cfx (3.30mm cutting balloon arthrectomy) - Dr. Mervyn Skeeters. Little   . Finger amputation      right finger  . Nm myocar perf wall motion  12/2005    bruce myoview; post-stress with normal pattern of perfusion, EF 56%, low risk     FAMHx:  Family History  Problem Relation Age of Onset  . Stroke Father   . Skin cancer Brother   . Valvular heart disease Daughter   . Mitral valve prolapse Child   . Diabetes Other     maternal side of family    SOCHx:   reports that he quit smoking about 11 years ago. He has never used smokeless tobacco. He reports that he does not drink alcohol or use illicit drugs.  ALLERGIES:  Allergies  Allergen Reactions  . Niaspan [Niacin Er]     Severe hot flashes    ROS: A comprehensive review of systems was negative except for: Genitourinary: positive for sexual problems  HOME MEDS: Current Outpatient Prescriptions  Medication Sig Dispense Refill  . aspirin 325 MG tablet Take 325 mg by mouth daily.      Marland Kitchen aspirin 81 MG tablet Take 81 mg by mouth daily. Alternates with       .  betamethasone dipropionate (DIPROLENE) 0.05 % ointment as needed.      . Chlorpheniramine Maleate (ALLERGY RELIEF PO) Take by mouth as needed.      . Clobetasol Propionate 0.05 % shampoo as needed.      Marland Kitchen HUMIRA PEN 40 MG/0.8ML injection every 14 (fourteen) days.      Marland Kitchen lisinopril (PRINIVIL,ZESTRIL) 10 MG tablet Take 10 mg by mouth daily.      . metoprolol succinate (TOPROL XL) 25 MG 24 hr tablet Take 1 tablet (25 mg total) by mouth daily.  30 tablet  11  . Multiple Vitamin (MULTIVITAMIN) capsule Take 1 capsule by mouth daily.      . nitroGLYCERIN (NITROSTAT) 0.4 MG SL tablet Place 0.4 mg under the tongue every 5 (five) minutes as needed for chest pain.      . Omega-3 Fatty Acids  (FISH OIL PO) Take 1 capsule by mouth 2 (two) times daily.      . Pseudoephedrine HCl (SUDAFED PO) Take by mouth as needed.      . simvastatin (ZOCOR) 40 MG tablet Take 1 tablet (40 mg total) by mouth daily.  30 tablet  11  . sildenafil (REVATIO) 20 MG tablet Take 2-3 tablets (40-60 mg total) by mouth daily as needed.  60 tablet  0   No current facility-administered medications for this visit.    LABS/IMAGING: No results found for this or any previous visit (from the past 48 hour(s)). No results found.  VITALS: BP 126/88  Pulse 70  Ht 6' (1.829 m)  Wt 198 lb 11.2 oz (90.13 kg)  BMI 26.94 kg/m2  EXAM: General appearance: alert and no distress Neck: no carotid bruit and no JVD Lungs: clear to auscultation bilaterally Heart: regular rate and rhythm, S1, S2 normal, no murmur, click, rub or gallop Abdomen: soft, non-tender; bowel sounds normal; no masses,  no organomegaly Extremities: extremities normal, atraumatic, no cyanosis or edema Pulses: 2+ and symmetric Skin: Multiple psoriatic lesions over the neck and back Neurologic: Grossly normal Psych: Mood, affect normal  EKG: Normal sinus rhythm at 70  ASSESSMENT: 1. Coronary artery disease status post PCI to circumflex in 2004 and to the right coronary artery in 2010 with Cypher stents 2. Hypertension 3. Dyslipidemia 4. Psoriasis 5. Erectile dysfunction  PLAN: 1.   Justin Dennis is doing fairly well without anginal complaints. He recently had a stress test which was negative for ischemia. Hypertension is at goal. We will check a lipid profile today. He is requesting additional Viagra which he said was successful with his erectile dysfunction. I would recommend the 20 mg tablets which she can obtain at a much lower cost. We will prescribe that for him today. Otherwise continue current medications.  Plan to see him back in one year.  Chrystie Nose, MD, [facility] Attending Cardiologist [facility]  HILTY,Kenneth C 10/19/2013,  8:39 AM

## 2013-10-19 NOTE — Patient Instructions (Signed)
Your physician has recommended you make the following change in your medication: start sildenafil 40-60mg  (2-3 tablets) as needed  Your physician recommends that you return for lab work at your convenience. You will need to be fasting.   Your physician wants you to follow-up in: 1 year with Dr. Rennis Golden. You will receive a reminder letter in the mail two months in advance. If you don't receive a letter, please call our office to schedule the follow-up appointment.

## 2013-12-20 ENCOUNTER — Telehealth: Payer: Self-pay | Admitting: Internal Medicine

## 2013-12-20 MED ORDER — SILDENAFIL CITRATE 20 MG PO TABS: 20.0000 mg | ORAL_TABLET | ORAL | Status: DC | PRN

## 2013-12-20 NOTE — Telephone Encounter (Signed)
[facility]

## 2013-12-20 NOTE — Telephone Encounter (Signed)
Pt called in stating that he would like to switch his Viagra to Sidenafil ane would like to prescription moved to the pharmacy in [place] that the nurse recommended. Please call   thanks

## 2013-12-20 NOTE — Telephone Encounter (Signed)
Yes .. Give him the 20 mg generic viagra- take 2-3 pills prior to sexual activity. Can get it cheap at [facility] in [place].  -Dr. HRexene Edison

## 2013-12-20 NOTE — Telephone Encounter (Signed)
Rx called in to pharmacy. 

## 2013-12-20 NOTE — Telephone Encounter (Signed)
Spoke with Dr.Hilty who authorized the increase in # of tablets. Elliot Gaultalled [name] at pharmacy and gave new amount of Rx

## 2013-12-20 NOTE — Telephone Encounter (Signed)
Justin SorrowOlga states that the pt is in the pharmacy and would like to know if he could receive 50 tabs for his Sildenafil prescription. Please call  Thanks

## 2014-02-09 ENCOUNTER — Telehealth: Payer: Self-pay | Admitting: *Deleted

## 2014-02-09 NOTE — Telephone Encounter (Signed)
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  >> concerns listed CAD, MI with stents in 2004 >> patient needs: cardiac clearance, negative stress test every 2 years, EF equal/greater than 40%  Dr. 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

## 2014-02-19 ENCOUNTER — Other Ambulatory Visit: Payer: Self-pay | Admitting: Internal Medicine

## 2014-02-19 NOTE — Telephone Encounter (Signed)
Rx(s) sent to pharmacy electronically.  

## 2014-12-21 ENCOUNTER — Other Ambulatory Visit: Payer: Self-pay | Admitting: Internal Medicine

## 2015-02-10 ENCOUNTER — Other Ambulatory Visit: Payer: Self-pay | Admitting: Internal Medicine

## 2015-03-08 ENCOUNTER — Encounter: Payer: Self-pay | Admitting: Internal Medicine

## 2015-03-08 ENCOUNTER — Ambulatory Visit (INDEPENDENT_AMBULATORY_CARE_PROVIDER_SITE_OTHER): Payer: Managed Care, Other (non HMO) | Admitting: Internal Medicine

## 2015-03-08 VITALS — BP 122/84 | HR 68 | Ht 72.0 in | Wt 201.0 lb

## 2015-03-08 DIAGNOSIS — I2510 Atherosclerotic heart disease of native coronary artery without angina pectoris: Secondary | ICD-10-CM

## 2015-03-08 DIAGNOSIS — I10 Essential (primary) hypertension: Secondary | ICD-10-CM | POA: Diagnosis not present

## 2015-03-08 DIAGNOSIS — N521 Erectile dysfunction due to diseases classified elsewhere: Secondary | ICD-10-CM

## 2015-03-08 DIAGNOSIS — I2129 ST elevation (STEMI) myocardial infarction involving other sites: Secondary | ICD-10-CM | POA: Diagnosis not present

## 2015-03-08 DIAGNOSIS — E785 Hyperlipidemia, unspecified: Secondary | ICD-10-CM

## 2015-03-08 MED ORDER — METOPROLOL SUCCINATE ER 25 MG PO TB24: 25.0000 mg | ORAL_TABLET | Freq: Every day | ORAL | Status: DC

## 2015-03-08 MED ORDER — LISINOPRIL 10 MG PO TABS: 10.0000 mg | ORAL_TABLET | Freq: Every day | ORAL | Status: DC

## 2015-03-08 MED ORDER — SIMVASTATIN 40 MG PO TABS: 40.0000 mg | ORAL_TABLET | Freq: Every day | ORAL | Status: DC

## 2015-03-08 MED ORDER — SILDENAFIL CITRATE 20 MG PO TABS: 20.0000 mg | ORAL_TABLET | ORAL | Status: AC | PRN

## 2015-03-08 MED ORDER — NITROGLYCERIN 0.4 MG SL SUBL: 0.4000 mg | SUBLINGUAL_TABLET | SUBLINGUAL | Status: DC | PRN

## 2015-03-08 NOTE — Progress Notes (Signed)
OFFICE NOTE  Chief Complaint:  Routine follow-up  Primary Care Physician: [name] Per Patient  HPI:  Justin Dennis is a pleasant 60 year old male with a significant history of heart disease. In 2004 he had an MI on his way to work. When he reached the office he actually took 3 aspirin and unfortunately continued to work throughout the day. When he got home he told his wife that he probably had a heart attack and she recommended he go see the doctor immediately. He was subsequently hospitalized the catheterization revealed a posterior MI. He had a Cypher stent put in the circumflex. ENT doesn't 10 he had recurrent coronary disease and received overlapping drug-eluting stents in the right coronary artery. The circumflex artery was noted to be restenotic and he had a cutting balloon atherectomy at that time. Since then he done fairly well. He is being medically managed and recently had a stress test in November of this past year which was negative for ischemia. He denies any further chest pain or shortness of breath. He also has dyslipidemia and a history of smoking in the past but stopped when he had his MI. In addition he has psoriasis and is currently on Humira and the clobetasol shampoo.  [name] returns early today for followup. He reports that his insurance is changing of his co-pay we'll go up to double the cost next year. He is asymptomatic at this time denies any chest pain or shortness of breath. 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 He is due for lipid profile checked. He is also having continual erectile dysfunction. He has had good success with Viagra however it is too expensive for him at the current dose.  I saw [name] today in follow-up. He says he feels better than he has in some time. 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 He does a lot of  welding and sheet-metal work and says that his environment is not necessarily conducive to that work. He denies any chest pain or worsening shortness of breath. He still has problems with erectile dysfunction is requesting Viagra. He also had some concerns recently about 2 of his family members/friends who had some procedural complications with stenting. He said that they were improved and cared for by 2 of my other partners. In the future, he would request that he has either Dr. Excell Seltzer or Clifton Damonie do any stenting if it is necessary.  PMHx:  Past Medical History  Diagnosis Date  . CAD (coronary artery disease)   . Hyperlipidemia   . Hypertension   . ED (erectile dysfunction)     Past Surgical History  Procedure Laterality Date  . Cardiac catheterization  2004    PMI, Cfx Cypher 3.0x29mm DES - Dr. Mervyn Skeeters. Little   . Cardiac catheterization  06/21/2008    Cypher stent (3.0x61mm) overlapping with XIENCFE 3.0x4mm DES to RCA, in-stent re-stenosis of Cfx (3.61mm cutting balloon arthrectomy) - Dr. Mervyn Skeeters. Little   . Finger amputation      right finger  . Nm myocar perf wall motion  12/2005    bruce myoview; post-stress with normal pattern of perfusion, EF 56%, low risk     FAMHx:  Family History  Problem Relation Age of Onset  . Stroke Father   . Skin cancer Brother   . Valvular heart disease Daughter   .  Mitral valve prolapse Child   . Diabetes Other     maternal side of family    SOCHx:   reports that he quit smoking about 13 years ago. He has never used smokeless tobacco. He reports that he does not drink alcohol or use illicit drugs.  ALLERGIES:  Allergies  Allergen Reactions  . Niaspan [Niacin Er]     Severe hot flashes    ROS: A comprehensive review of systems was negative except for: Genitourinary: positive for sexual problems  HOME MEDS: Current Outpatient Prescriptions  Medication Sig Dispense Refill  . aspirin 81 MG tablet Take 81 mg by mouth daily. Alternates with       . betamethasone dipropionate (DIPROLENE) 0.05 % ointment as needed.    . Chlorpheniramine Maleate (ALLERGY RELIEF PO) Take by mouth as needed.    . Clobetasol Propionate 0.05 % shampoo as needed.    Marland Kitchen HUMIRA PEN 40 MG/0.8ML injection every 14 (fourteen) days.    Marland Kitchen lisinopril (PRINIVIL,ZESTRIL) 10 MG tablet Take 1 tablet (10 mg total) by mouth daily. 30 tablet 11  . metoprolol succinate (TOPROL-XL) 25 MG 24 hr tablet Take 1 tablet (25 mg total) by mouth daily. 30 tablet 11  . Multiple Vitamin (MULTIVITAMIN) capsule Take 1 capsule by mouth daily.    . nitroGLYCERIN (NITROSTAT) 0.4 MG SL tablet Place 1 tablet (0.4 mg total) under the tongue every 5 (five) minutes as needed for chest pain. 25 tablet 3  . Omega-3 Fatty Acids (FISH OIL PO) Take 1 capsule by mouth 2 (two) times daily.    . Pseudoephedrine HCl (SUDAFED PO) Take by mouth as needed.    . sildenafil (REVATIO) 20 MG tablet Take 1 tablet (20 mg total) by mouth as needed. 50 tablet 0  . simvastatin (ZOCOR) 40 MG tablet Take 1 tablet (40 mg total) by mouth daily. 30 tablet 11   No current facility-administered medications for this visit.    LABS/IMAGING: No results found for this or any previous visit (from the past 48 hour(s)). No results found.  VITALS: BP 122/84 mmHg  Pulse 68  Ht 6' (1.829 m)  Wt 201 lb (91.173 kg)  BMI 27.25 kg/m2  EXAM: General appearance: alert and no distress Neck: no carotid bruit and no JVD Lungs: clear to auscultation bilaterally Heart: regular rate and rhythm, S1, S2 normal, no murmur, click, rub or gallop Abdomen: soft, non-tender; bowel sounds normal; no masses,  no organomegaly Extremities: extremities normal, atraumatic, no cyanosis or edema Pulses: 2+ and symmetric Skin: Multiple psoriatic lesions over the neck and back Neurologic: Grossly normal Psych: Mood, affect normal  EKG: Normal sinus rhythm at 68  ASSESSMENT: 1. Coronary artery disease status post PCI to circumflex in 2004 and  to the right coronary artery in 2010 with Cypher stents 2. Hypertension 3. Dyslipidemia 4. Psoriasis 5. Erectile dysfunction  PLAN: 1.   Mr. Rising is doing fairly well without anginal complaints. Hypertension is at goal. We will check a lipid profile today. He is requesting additional Viagra which he said was successful with his erectile dysfunction. I would recommend the 20 mg tablets which she can obtain at a much lower cost. We will prescribe that for him today. Otherwise continue current medications. He does not have a primary care provider and I've encouraged him to get one for routine screening tests.  Plan to see him back in one year.  Chrystie Nose, MD, [facility] Attending Cardiologist [facility]  Chrystie Nose 03/08/2015, 5:21 PM

## 2015-03-08 NOTE — Patient Instructions (Addendum)
Your physician wants you to follow-up in: 1 year with Dr. Rennis Golden. You will receive a reminder letter in the mail two months in advance. If you don't receive a letter, please call our office to schedule the follow-up appointment.  Your medications have been refilled.   Your physician recommends that you continue on your current medications as directed. Please refer to the Current Medication list given to you today.

## 2016-03-18 ENCOUNTER — Other Ambulatory Visit: Payer: Self-pay | Admitting: Internal Medicine

## 2016-03-18 DIAGNOSIS — N521 Erectile dysfunction due to diseases classified elsewhere: Secondary | ICD-10-CM

## 2016-03-18 DIAGNOSIS — E785 Hyperlipidemia, unspecified: Secondary | ICD-10-CM

## 2016-03-18 DIAGNOSIS — I2510 Atherosclerotic heart disease of native coronary artery without angina pectoris: Secondary | ICD-10-CM

## 2016-03-18 DIAGNOSIS — I2129 ST elevation (STEMI) myocardial infarction involving other sites: Secondary | ICD-10-CM

## 2016-03-18 DIAGNOSIS — I10 Essential (primary) hypertension: Secondary | ICD-10-CM

## 2016-04-20 ENCOUNTER — Ambulatory Visit: Payer: Managed Care, Other (non HMO) | Admitting: Internal Medicine

## 2016-04-20 ENCOUNTER — Encounter: Payer: Self-pay | Admitting: *Deleted

## 2016-07-16 ENCOUNTER — Ambulatory Visit (INDEPENDENT_AMBULATORY_CARE_PROVIDER_SITE_OTHER): Payer: Managed Care, Other (non HMO) | Admitting: Internal Medicine

## 2016-07-16 ENCOUNTER — Encounter: Payer: Self-pay | Admitting: Internal Medicine

## 2016-07-16 DIAGNOSIS — I2510 Atherosclerotic heart disease of native coronary artery without angina pectoris: Secondary | ICD-10-CM | POA: Diagnosis not present

## 2016-07-16 DIAGNOSIS — N521 Erectile dysfunction due to diseases classified elsewhere: Secondary | ICD-10-CM

## 2016-07-16 DIAGNOSIS — I10 Essential (primary) hypertension: Secondary | ICD-10-CM | POA: Diagnosis not present

## 2016-07-16 DIAGNOSIS — I2129 ST elevation (STEMI) myocardial infarction involving other sites: Secondary | ICD-10-CM

## 2016-07-16 DIAGNOSIS — E785 Hyperlipidemia, unspecified: Secondary | ICD-10-CM | POA: Diagnosis not present

## 2016-07-16 MED ORDER — LISINOPRIL 10 MG PO TABS: 10.0000 mg | ORAL_TABLET | Freq: Every day | ORAL | 11 refills | Status: DC

## 2016-07-16 MED ORDER — SIMVASTATIN 40 MG PO TABS: 40.0000 mg | ORAL_TABLET | Freq: Every day | ORAL | 11 refills | Status: DC

## 2016-07-16 MED ORDER — METOPROLOL SUCCINATE ER 25 MG PO TB24: 25.0000 mg | ORAL_TABLET | Freq: Every day | ORAL | 11 refills | Status: DC

## 2016-07-16 NOTE — Patient Instructions (Signed)
Your physician wants you to follow-up in: ONE YEAR with Dr. Hilty. You will receive a reminder letter in the mail two months in advance. If you don't receive a letter, please call our office to schedule the follow-up appointment.  

## 2016-07-16 NOTE — Progress Notes (Signed)
OFFICE NOTE  Chief Complaint:  Routine follow-up  Primary Care Physician: Patient, [name] Per  HPI:  Justin Dennis is a pleasant 61 year old male with a significant history of heart disease. In 2004 he had an MI on his way to work. When he reached the office he actually took 3 aspirin and unfortunately continued to work throughout the day. When he got home he told his wife that he probably had a heart attack and she recommended he go see the doctor immediately. He was subsequently hospitalized the catheterization revealed a posterior MI. He had a Cypher stent put in the circumflex. ENT doesn't 10 he had recurrent coronary disease and received overlapping drug-eluting stents in the right coronary artery. The circumflex artery was noted to be restenotic and he had a cutting balloon atherectomy at that time. Since then he done fairly well. He is being medically managed and recently had a stress test in November of this past year which was negative for ischemia. He denies any further chest pain or shortness of breath. He also has dyslipidemia and a history of smoking in the past but stopped when he had his MI. In addition he has psoriasis and is currently on Humira and the clobetasol shampoo.  Justin Dennis returns early today for followup. He reports that his insurance is changing of his co-pay we'll go up to double the cost next year. He is asymptomatic at this time denies any chest pain or shortness of breath. 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 He is due for lipid profile checked. He is also having continual erectile dysfunction. He has had good success with Viagra however it is too expensive for him at the current dose.  I saw Justin Dennis today in follow-up. He says he feels better than he has in some time. 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 He denies any chest pain or worsening shortness of breath. He still has problems with erectile dysfunction is requesting Viagra. He also had some concerns recently about 2 of his family members/friends who had some procedural complications with stenting. He said that they were improved and cared for by 2 of my other partners. In the future, he would request that he has either Dr. Excell Seltzer or Clifton Vernie do any stenting if it is necessary.  07/16/2016  Justin Dennis returns today for follow-up. Overall he seems to be doing well. 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 He is more physically active. He denies any chest pain or worsening shortness of breath. Blood pressures been well controlled. He brought in lab work that he had drawn last year which shows an LDL-C of 88. This is near goal for his coronary artery disease and he reports he will have a recheck of his cholesterol soon. He can only get blood work once a year based on his insurance. Today blood pressures at goal 112/74. He denies needing to use Viagra recently.  PMHx:  Past Medical History:  Diagnosis Date  . CAD (coronary artery disease)   . ED (erectile dysfunction)   . Hyperlipidemia   . Hypertension     Past Surgical History:  Procedure Laterality Date  . CARDIAC CATHETERIZATION  2004   PMI, Cfx Cypher 3.0x6618mm DES - Dr. Mervyn SkeetersA. Little   . CARDIAC CATHETERIZATION  06/21/2008   Cypher stent (3.0x7418mm) overlapping with XIENCFE 3.0x9415mm DES to RCA, in-stent re-stenosis of Cfx (3.705mm cutting balloon arthrectomy) - Dr. Mervyn SkeetersA. Little   . FINGER AMPUTATION     right finger  . NM MYOCAR PERF WALL MOTION  12/2005   bruce myoview; post-stress with normal pattern of perfusion, EF 56%, low risk     FAMHx:  Family History  Problem Relation Age of Onset  . Stroke Father   . Skin cancer Brother     . Valvular heart disease Daughter   . Mitral valve prolapse Child   . Diabetes Other        maternal side of family    SOCHx:   reports that he quit smoking about 14 years ago. He has never used smokeless tobacco. He reports that he does not drink alcohol or use drugs.  ALLERGIES:  Allergies  Allergen Reactions  . Niaspan [Niacin Er]     Severe hot flashes    ROS: Pertinent items noted in HPI and remainder of comprehensive ROS otherwise negative.  HOME MEDS: Current Outpatient Prescriptions  Medication Sig Dispense Refill  . aspirin 81 MG tablet Take 81 mg by mouth daily. Alternates with 325mg     . betamethasone dipropionate (DIPROLENE) 0.05 % ointment as needed.    . Chlorpheniramine Maleate (ALLERGY RELIEF PO) Take by mouth as needed.    . Clobetasol Propionate 0.05 % shampoo as needed.    Marland Kitchen. HUMIRA PEN 40 MG/0.8ML injection every 14 (fourteen) days.    Marland Kitchen. lisinopril (PRINIVIL,ZESTRIL) 10 MG tablet Take 1 tablet (10 mg total) by mouth daily. 30 tablet 11  . metoprolol succinate (TOPROL-XL) 25 MG 24 hr tablet take 1 tablet by mouth once daily 30 tablet 0  . Multiple Vitamin (MULTIVITAMIN) capsule Take 1 capsule by mouth daily.    . nitroGLYCERIN (NITROSTAT) 0.4 MG SL tablet Place 1 tablet (0.4 mg total) under the tongue every 5 (five) minutes as needed for chest pain. 25 tablet 3  . Omega-3 Fatty Acids (FISH OIL PO) Take 1 capsule by mouth 2 (two) times daily.    . Pseudoephedrine HCl (SUDAFED PO) Take by mouth as needed.    . sildenafil (REVATIO) 20 MG tablet Take 1 tablet (20 mg total) by mouth as needed. 50 tablet 0  . simvastatin (ZOCOR) 40 MG tablet take 1 tablet by mouth once daily 30 tablet 0   No current facility-administered medications for this visit.     LABS/IMAGING: No results found for this or any previous visit (from the past 48 hour(s)). No results found.  VITALS: BP 112/74   Pulse 73   Ht 6' (1.829 m)   Wt 200 lb 9.6 oz (91 kg)   BMI 27.21 kg/m    EXAM: General appearance: alert and no distress Neck: no carotid bruit and no JVD Lungs: clear to auscultation bilaterally Heart: regular rate and rhythm, S1, S2 normal, no murmur, click, rub or gallop Abdomen: soft, non-tender; bowel sounds normal; no masses,  no organomegaly Extremities: extremities normal, atraumatic, no cyanosis or edema Pulses: 2+ and symmetric Skin: Skin color, texture, turgor normal. No rashes or lesions Neurologic: Grossly normal Psych: Pleasant  EKG: Normal sinus rhythm at 73  ASSESSMENT: 1. Coronary artery disease status post PCI to circumflex in 2004 and to the right coronary artery in 2010 with Cypher stents 2. Hypertension 3. Dyslipidemia 4. Psoriasis 5.  Erectile dysfunction  PLAN: 1.   Mr. Sirico remains asymptomatic without any further chest pain. Blood pressure is at goal. His cholesterol slightly above goal LDL-C less than 70. He should have a repeat lipid profile next month. I asked him to provide a copy of that and if he continues to not be a goal may consider switching to a more potent statin or adding ezetimibe.  Plan to see him back in one year.  Chrystie Nose, MD, [facility] Attending Cardiologist [facility]  Chrystie Nose 07/16/2016, 3:35 PM

## 2016-12-09 ENCOUNTER — Telehealth: Payer: Self-pay | Admitting: Internal Medicine

## 2016-12-09 NOTE — Telephone Encounter (Signed)
Ok to hold aspirin 7 days prior to colonoscopy and resume after.  Dr. HRexene Edison

## 2016-12-09 NOTE — Telephone Encounter (Addendum)
   [facility] Pre-operative Risk Assessment    Request for surgical clearance:  1. What type of surgery is being performed? colonoscopy   2. When is this surgery scheduled? 12/22/2016   3. Are there any medications that need to be held prior to surgery and how long? none specified    4. Practice name and name of physician performing surgery? Dr. Carol Ada @ [facility]  5. What is your office phone and fax number? (p) [phone]  (f) [phone]  6. Anesthesia type (None, local, MAC, general) ? Propofol    Justin Dennis 12/09/2016, 7:45 AM  _________________________________________________________________   (provider comments below)

## 2016-12-09 NOTE — Telephone Encounter (Signed)
    Chart reviewed as part of pre-operative protocol coverage. Last seen by Dr. Rennis GoldenHilty 07/16/16. Left voice mail to call back to review symptoms.   Will route to Dr. Rennis GoldenHilty to review ASA.      PearlBhagat,Danetra Glock, PA  12/09/2016, 2:20 PM

## 2016-12-10 NOTE — Telephone Encounter (Signed)
   Chart reviewed as part of pre-operative protocol coverage. Given past medical history and time since last visit, based on ACC/AHA guidelines, Justin Dennis would be at acceptable risk for the planned procedure without further cardiovascular testing. Dr. Rennis Goldenhilty has provided clearance for the patient to hold aspirin for 7 days prior to colonoscopy and recommended aspirin be resumed afterwards.   I will route this recommendation to the requesting party via Epic fax function and remove from pre-op pool.  Please call with questions.  Eula Listenyan Dunn, PA-C 12/10/2016, 1:19 PM

## 2016-12-10 NOTE — Telephone Encounter (Signed)
Mr. Justin Dennis is returning your call . Please call at [phone]

## 2016-12-15 NOTE — Telephone Encounter (Signed)
Clearance routed via Epic fax. 

## 2017-08-08 ENCOUNTER — Other Ambulatory Visit: Payer: Self-pay | Admitting: Internal Medicine

## 2017-08-08 DIAGNOSIS — E785 Hyperlipidemia, unspecified: Secondary | ICD-10-CM

## 2017-08-08 DIAGNOSIS — I2510 Atherosclerotic heart disease of native coronary artery without angina pectoris: Secondary | ICD-10-CM

## 2017-08-08 DIAGNOSIS — I2129 ST elevation (STEMI) myocardial infarction involving other sites: Secondary | ICD-10-CM

## 2017-08-08 DIAGNOSIS — N521 Erectile dysfunction due to diseases classified elsewhere: Secondary | ICD-10-CM

## 2017-08-08 DIAGNOSIS — I10 Essential (primary) hypertension: Secondary | ICD-10-CM

## 2017-11-09 ENCOUNTER — Ambulatory Visit: Payer: Managed Care, Other (non HMO) | Admitting: Internal Medicine

## 2017-11-18 ENCOUNTER — Encounter: Payer: Self-pay | Admitting: Internal Medicine

## 2017-11-18 ENCOUNTER — Ambulatory Visit: Payer: Managed Care, Other (non HMO) | Admitting: Internal Medicine

## 2017-11-18 VITALS — BP 148/90 | HR 85 | Ht 72.0 in | Wt 210.0 lb

## 2017-11-18 DIAGNOSIS — E785 Hyperlipidemia, unspecified: Secondary | ICD-10-CM | POA: Diagnosis not present

## 2017-11-18 DIAGNOSIS — I10 Essential (primary) hypertension: Secondary | ICD-10-CM | POA: Diagnosis not present

## 2017-11-18 DIAGNOSIS — I2510 Atherosclerotic heart disease of native coronary artery without angina pectoris: Secondary | ICD-10-CM | POA: Diagnosis not present

## 2017-11-18 MED ORDER — METOPROLOL SUCCINATE ER 25 MG PO TB24: ORAL_TABLET | ORAL | 3 refills | Status: DC

## 2017-11-18 MED ORDER — EZETIMIBE-SIMVASTATIN 10-40 MG PO TABS: 1.0000 | ORAL_TABLET | Freq: Every day | ORAL | 3 refills | Status: DC

## 2017-11-18 MED ORDER — LISINOPRIL 10 MG PO TABS: 10.0000 mg | ORAL_TABLET | Freq: Every day | ORAL | 3 refills | Status: DC

## 2017-11-18 NOTE — Progress Notes (Signed)
OFFICE NOTE  Chief Complaint:  Routine follow-up  Primary Care Physician: Patient, [name] Per  HPI:  Justin Dennis is a pleasant 62 year old male with a significant history of heart disease. In 2004 he had an MI on his way to work. When he reached the office he actually took 3 aspirin and unfortunately continued to work throughout the day. When he got home he told his wife that he probably had a heart attack and she recommended he go see the doctor immediately. He was subsequently hospitalized the catheterization revealed a posterior MI. He had a Cypher stent put in the circumflex. ENT doesn't 10 he had recurrent coronary disease and received overlapping drug-eluting stents in the right coronary artery. The circumflex artery was noted to be restenotic and he had a cutting balloon atherectomy at that time. Since then he done fairly well. He is being medically managed and recently had a stress test in November of this past year which was negative for ischemia. He denies any further chest pain or shortness of breath. He also has dyslipidemia and a history of smoking in the past but stopped when he had his MI. In addition he has psoriasis and is currently on Humira and the clobetasol shampoo.  Justin Dennis returns early today for followup. He reports that his insurance is changing of his co-pay we'll go up to double the cost next year. He is asymptomatic at this time denies any chest pain or shortness of breath. He is active working involved in Psychologist, occupational. He reports his psoriasis has been well controlled. He is due for lipid profile checked. He is also having continual erectile dysfunction. He has had good success with Viagra however it is too expensive for him at the current dose.  I saw Justin Dennis today in follow-up. He says he feels better than he has in some time. 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 He denies any chest pain or worsening shortness of breath. He still has problems with erectile dysfunction is requesting Viagra. He also had some concerns recently about 2 of his family members/friends who had some procedural complications with stenting. He said that they were improved and cared for by 2 of my other partners. In the future, he would request that he has either Dr. Burt Knack or Angelena Form do any stenting if it is necessary.  07/16/2016  Justin Dennis returns today for follow-up. Overall he seems to be doing well. 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 He is more physically active. He denies any chest pain or worsening shortness of breath. Blood pressures been well controlled. He brought in lab work that he had drawn last year which shows an LDL-C of 88. This is near goal for his coronary artery disease and he reports he will have a recheck of his cholesterol soon. He can only get blood work once a year based on his insurance. Today blood pressures at goal 112/74. He denies needing to use Viagra recently.  11/19/2017  Justin Dennis returns today for follow-up.  In general he is doing well.  He recently had a lipid profile which demonstrated a total cholesterol of 152, HDL 37, triglycerides 115 and LDL 94.  He denies any chest pain or  worsening shortness of breath. Blood pressure is noted to be elevated today.  He was previously taking lisinopril but for some reason stopped the medication.  He is compliant with metoprolol and simvastatin.  PMHx:  Past Medical History:  Diagnosis Date  . CAD (coronary artery disease)   . ED (erectile dysfunction)   . Hyperlipidemia   . Hypertension     Past Surgical History:  Procedure Laterality Date  . CARDIAC CATHETERIZATION  2004   PMI, Cfx Cypher 3.0x79mm DES - Dr. Mervyn Skeeters. Little   . CARDIAC  CATHETERIZATION  06/21/2008   Cypher stent (3.0x30mm) overlapping with XIENCFE 3.0x8mm DES to RCA, in-stent re-stenosis of Cfx (3.47mm cutting balloon arthrectomy) - Dr. Mervyn Skeeters. Little   . FINGER AMPUTATION     right finger  . NM MYOCAR PERF WALL MOTION  12/2005   bruce myoview; post-stress with normal pattern of perfusion, EF 56%, low risk     FAMHx:  Family History  Problem Relation Age of Onset  . Stroke Father   . Skin cancer Brother   . Valvular heart disease Daughter   . Mitral valve prolapse Child   . Diabetes Other        maternal side of family    SOCHx:   reports that he quit smoking about 15 years ago. He has never used smokeless tobacco. He reports that he does not drink alcohol or use drugs.  ALLERGIES:  Allergies  Allergen Reactions  . Niaspan [Niacin Er]     Severe hot flashes    ROS: Pertinent items noted in HPI and remainder of comprehensive ROS otherwise negative.  HOME MEDS: Current Outpatient Medications  Medication Sig Dispense Refill  . aspirin 81 MG tablet Take 81 mg by mouth daily. Alternates with 325mg     . betamethasone dipropionate (DIPROLENE) 0.05 % ointment as needed.    . Chlorpheniramine Maleate (ALLERGY RELIEF PO) Take by mouth as needed.    . Clobetasol Propionate 0.05 % shampoo as needed.    Marland Kitchen HUMIRA PEN 40 MG/0.8ML injection every 14 (fourteen) days.    Marland Kitchen lisinopril (PRINIVIL,ZESTRIL) 10 MG tablet Take 1 tablet (10 mg total) by mouth daily. 30 tablet 11  . metoprolol succinate (TOPROL-XL) 25 MG 24 hr tablet TAKE 1 TABLET(25 MG) BY MOUTH DAILY 15 tablet 0  . Multiple Vitamin (MULTIVITAMIN) capsule Take 1 capsule by mouth daily.    . nitroGLYCERIN (NITROSTAT) 0.4 MG SL tablet Place 1 tablet (0.4 mg total) under the tongue every 5 (five) minutes as needed for chest pain. 25 tablet 3  . Omega-3 Fatty Acids (FISH OIL PO) Take 1 capsule by mouth 2 (two) times daily.    . Pseudoephedrine HCl (SUDAFED PO) Take by mouth as needed.    . sildenafil  (REVATIO) 20 MG tablet Take 1 tablet (20 mg total) by mouth as needed. 50 tablet 0  . simvastatin (ZOCOR) 40 MG tablet TAKE 1 TABLET(40 MG) BY MOUTH DAILY 15 tablet 0   No current facility-administered medications for this visit.     LABS/IMAGING: No results found for this or any previous visit (from the past 48 hour(s)). No results found.  VITALS: BP (!) 148/90   Pulse 85   Ht 6' (1.829 m)   Wt 210 lb (95.3 kg)   BMI 28.48 kg/m   EXAM: General appearance: alert and no distress Neck: no carotid bruit and no JVD Lungs: clear to auscultation bilaterally Heart: regular rate and rhythm, S1, S2 normal, no murmur, click, rub or gallop  Abdomen: soft, non-tender; bowel sounds normal; no masses,  no organomegaly Extremities: extremities normal, atraumatic, no cyanosis or edema Pulses: 2+ and symmetric Skin: Skin color, texture, turgor normal. No rashes or lesions Neurologic: Grossly normal Psych: Pleasant  EKG: Normal sinus rhythm at 85-personally reviewed  ASSESSMENT: 1. Coronary artery disease status post PCI to circumflex in 2004 and to the right coronary artery in 2010 with Cypher stents 2. Hypertension 3. Dyslipidemia 4. Psoriasis 5. Erectile dysfunction  PLAN: 1.   Justin Dennis has elevated blood pressure today which has been running high however he has been off of lisinopril.  I asked him to resume 10 mg daily and provide a new prescription.  His LDL cholesterol is not a goal less than 70 therefore I would recommend starting a combination of simvastatin and ezetimibe.  He should have a repeat lipid profile in 3 months.  Otherwise plan follow-up with me annually or sooner as necessary.  Chrystie Nose, MD, [facility], FACP  [place]  [facility]  Medical Director of the Advanced Lipid Disorders &  Cardiovascular Risk Reduction Clinic Diplomate of the American Board of Clinical Lipidology Attending Cardiologist  Direct Dial: [phone]  Fax: [phone]  Website:   [email or web address] [email or web address] 11/18/2017, 4:05 PM

## 2017-11-18 NOTE — Patient Instructions (Signed)
Medication Instructions:  RESUME lisinopril  STOP simvastatin  START combination vytorin 10/40mg  daily (combination of simvastatin + zetia) If you need a refill on your cardiac medications before your next appointment, please call your pharmacy.   Lab work: FASTING lab work to [facility] in late January or early February  If you have labs (blood work) drawn today and your tests are completely normal, you will receive your results only by: Marland Kitchen MyChart Message (if you have MyChart) OR . A paper copy in the mail If you have any lab test that is abnormal or we need to change your treatment, we will call you to review the results.  Testing/Procedures: NONE  Follow-Up: At [facility], you and your health needs are our priority.  As part of our continuing mission to provide you with exceptional heart care, we have created designated Provider Care Teams.  These Care Teams include your primary Cardiologist (physician) and Advanced Practice Providers (APPs -  Physician Assistants and Nurse Practitioners) who all work together to provide you with the care you need, when you need it. You will need a follow up appointment in ONE YEAR.  Please call our office 2 months in advance to schedule this appointment.  You may see Dr. Rennis Golden or one of the following Advanced Practice Providers on your designated Care Team: Azalee Course, [place] . Micah Flesher, PA-C  Any Other Special Instructions Will Be Listed Below (If Applicable).

## 2017-11-19 ENCOUNTER — Encounter: Payer: Self-pay | Admitting: Internal Medicine

## 2018-09-16 ENCOUNTER — Other Ambulatory Visit: Payer: Self-pay | Admitting: Internal Medicine

## 2018-09-16 MED ORDER — METOPROLOL SUCCINATE ER 25 MG PO TB24: ORAL_TABLET | ORAL | 0 refills | Status: DC

## 2018-09-16 MED ORDER — EZETIMIBE-SIMVASTATIN 10-40 MG PO TABS: 1.0000 | ORAL_TABLET | Freq: Every day | ORAL | 0 refills | Status: DC

## 2018-09-16 NOTE — Telephone Encounter (Signed)
New Message    *STAT* If patient is at the pharmacy, call can be transferred to refill team.   1. Which medications need to be refilled? (please list name of each medication and dose if known) metoprolol succinate (TOPROL-XL) 25 MG 24 hr tablet  ezetimibe-simvastatin (VYTORIN) 10-40 MG tablet  2. Which pharmacy/location (including street and city if local pharmacy) is medication to be sent to? [facility], [place] - [address] AT [place]  3. Do they need a 30 day or 90 day supply? 30 day supply

## 2018-09-16 NOTE — Telephone Encounter (Signed)
Pt's medications were sent to pt's pharmacy as requested. Confirmation received.  

## 2018-12-12 ENCOUNTER — Other Ambulatory Visit: Payer: Self-pay | Admitting: Internal Medicine

## 2019-03-12 ENCOUNTER — Other Ambulatory Visit: Payer: Self-pay | Admitting: Internal Medicine

## 2019-04-14 ENCOUNTER — Encounter (INDEPENDENT_AMBULATORY_CARE_PROVIDER_SITE_OTHER): Payer: Self-pay

## 2019-04-14 ENCOUNTER — Other Ambulatory Visit: Payer: Self-pay

## 2019-04-14 ENCOUNTER — Ambulatory Visit: Payer: Managed Care, Other (non HMO) | Admitting: Internal Medicine

## 2019-04-14 ENCOUNTER — Encounter: Payer: Self-pay | Admitting: Internal Medicine

## 2019-04-14 VITALS — BP 136/72 | HR 72 | Temp 97.0°F | Ht 72.0 in | Wt 217.2 lb

## 2019-04-14 DIAGNOSIS — I2510 Atherosclerotic heart disease of native coronary artery without angina pectoris: Secondary | ICD-10-CM

## 2019-04-14 DIAGNOSIS — I10 Essential (primary) hypertension: Secondary | ICD-10-CM

## 2019-04-14 DIAGNOSIS — N521 Erectile dysfunction due to diseases classified elsewhere: Secondary | ICD-10-CM

## 2019-04-14 DIAGNOSIS — I2129 ST elevation (STEMI) myocardial infarction involving other sites: Secondary | ICD-10-CM | POA: Diagnosis not present

## 2019-04-14 DIAGNOSIS — E785 Hyperlipidemia, unspecified: Secondary | ICD-10-CM | POA: Diagnosis not present

## 2019-04-14 MED ORDER — NITROGLYCERIN 0.4 MG SL SUBL: 0.4000 mg | SUBLINGUAL_TABLET | SUBLINGUAL | 3 refills | Status: DC | PRN

## 2019-04-14 NOTE — Progress Notes (Signed)
OFFICE NOTE  Chief Complaint:  Routine follow-up  Primary Care Physician: Patient, [name] Per  HPI:  Justin Dennis is a pleasant 64 year old male with a significant history of heart disease. In 2004 he had an MI on his way to work. When he reached the office he actually took 3 aspirin and unfortunately continued to work throughout the day. When he got home he told his wife that he probably had a heart attack and she recommended he go see the doctor immediately. He was subsequently hospitalized the catheterization revealed a posterior MI. He had a Cypher stent put in the circumflex. ENT doesn't 10 he had recurrent coronary disease and received overlapping drug-eluting stents in the right coronary artery. The circumflex artery was noted to be restenotic and he had a cutting balloon atherectomy at that time. Since then he done fairly well. He is being medically managed and recently had a stress test in November of this past year which was negative for ischemia. He denies any further chest pain or shortness of breath. He also has dyslipidemia and a history of smoking in the past but stopped when he had his MI. In addition he has psoriasis and is currently on Humira and the clobetasol shampoo.  Justin Dennis returns early today for followup. He reports that his insurance is changing of his co-pay we'll go up to double the cost next year. He is asymptomatic at this time denies any chest pain or shortness of breath. He is active working involved in Psychologist, occupational. He reports his psoriasis has been well controlled. He is due for lipid profile checked. He is also having continual erectile dysfunction. He has had good success with Viagra however it is too expensive for him at the current dose.  I saw Justin Dennis today in follow-up. He says he feels better than he has in some time. 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 He denies any chest pain or worsening shortness of breath. He still has problems with erectile dysfunction is requesting Viagra. He also had some concerns recently about 2 of his family members/friends who had some procedural complications with stenting. He said that they were improved and cared for by 2 of my other partners. In the future, he would request that he has either Dr. Burt Knack or Angelena Form do any stenting if it is necessary.  07/16/2016  Justin Dennis returns today for follow-up. Overall he seems to be doing well. 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 He is more physically active. He denies any chest pain or worsening shortness of breath. Blood pressures been well controlled. He brought in lab work that he had drawn last year which shows an LDL-C of 88. This is near goal for his coronary artery disease and he reports he will have a recheck of his cholesterol soon. He can only get blood work once a year based on his insurance. Today blood pressures at goal 112/74. He denies needing to use Viagra recently.  11/19/2017  Justin Dennis returns today for follow-up.  In general he is doing well.  He recently had a lipid profile which demonstrated a total cholesterol of 152, HDL 37, triglycerides 115 and LDL 94.  He denies any chest pain or  worsening shortness of breath. Blood pressure is noted to be elevated today.  He was previously taking lisinopril but for some reason stopped the medication.  He is compliant with metoprolol and simvastatin.  04/14/2019  Justin Dennis is seen today in follow-up.  Overall he says he feels well denies any chest pain or worsening shortness of breath.  EKG today shows sinus rhythm at 72 however there are inferior T wave abnormalities which are new.  Blood pressures well controlled 136/72.  He reports compliance with his  medications.  He did have recent labs in December 2020 which showed total cholesterol 137, HDL 43, triglycerides 107 and LDL of 75.  PMHx:  Past Medical History:  Diagnosis Date  . CAD (coronary artery disease)   . ED (erectile dysfunction)   . Hyperlipidemia   . Hypertension     Past Surgical History:  Procedure Laterality Date  . CARDIAC CATHETERIZATION  2004   PMI, Cfx Cypher 3.0x79mm DES - Dr. Mervyn Skeeters. [place]   . CARDIAC CATHETERIZATION  06/21/2008   Cypher stent (3.0x69mm) overlapping with XIENCFE 3.0x19mm DES to RCA, in-stent re-stenosis of Cfx (3.41mm cutting balloon arthrectomy) - Dr. Mervyn Skeeters. [place]   . FINGER AMPUTATION     right finger  . NM MYOCAR PERF WALL MOTION  12/2005   bruce myoview; post-stress with normal pattern of perfusion, EF 56%, low risk     FAMHx:  Family History  Problem Relation Age of Onset  . Stroke Father   . Skin cancer Brother   . Valvular heart disease Daughter   . Mitral valve prolapse Child   . Diabetes Other        maternal side of family    SOCHx:   reports that he quit smoking about 17 years ago. He has never used smokeless tobacco. He reports that he does not drink alcohol or use drugs.  ALLERGIES:  Allergies  Allergen Reactions  . Niaspan [Niacin Er]     Severe hot flashes    ROS: Pertinent items noted in HPI and remainder of comprehensive ROS otherwise negative.  HOME MEDS: Current Outpatient Medications  Medication Sig Dispense Refill  . aspirin 81 MG tablet Take 81 mg by mouth daily. Alternates with 325mg     . betamethasone dipropionate (DIPROLENE) 0.05 % ointment as needed.    . Chlorpheniramine Maleate (ALLERGY RELIEF PO) Take by mouth as needed.    . Clobetasol Propionate 0.05 % shampoo as needed.    . ergocalciferol (VITAMIN D2) 1.25 MG (50000 UT) capsule ergocalciferol (vitamin D2) 1,250 mcg (50,000 unit) capsule    . ezetimibe-simvastatin (VYTORIN) 10-40 MG tablet Take 1 tablet by mouth daily. KEEP OV. 90 tablet 0  . HUMIRA  PEN 40 MG/0.8ML injection every 14 (fourteen) days.    . influenza vaccine (FLUCELVAX QUADRIVALENT) 0.5 ML injection Flucelvax Quad 2019-2020 (PF) 60 mcg (15 mcg x 4)/0.5 mL IM syringe  ADM 0.5ML IM UTD    . lisinopril (PRINIVIL,ZESTRIL) 10 MG tablet Take 1 tablet (10 mg total) by mouth daily. 90 tablet 3  . metoprolol succinate (TOPROL-XL) 25 MG 24 hr tablet Take 1 tablet (25 mg total) by mouth daily. KEEP OV. 90 tablet 0  . Multiple Vitamin (MULTIVITAMIN) capsule Take 1 capsule by mouth daily.    . nitroGLYCERIN (NITROSTAT) 0.4 MG SL tablet Place 1 tablet (0.4 mg total) under the tongue every 5 (five) minutes as needed for chest pain. 25 tablet 3  . Omega-3 Fatty Acids (FISH OIL PO) Take 1 capsule by  mouth 2 (two) times daily.    . Pseudoephedrine HCl (SUDAFED PO) Take by mouth as needed.    . sildenafil (REVATIO) 20 MG tablet Take 1 tablet (20 mg total) by mouth as needed. 50 tablet 0  . Influenza Virus Vacc Split PF 0.5 ML SUSY Afluria 2014-2015(PF) 45 mcg (15 mcg x 3)/0.5 mL intramuscular syringe  inject 0.5 milliliter intramuscularly     No current facility-administered medications for this visit.    LABS/IMAGING: No results found for this or any previous visit (from the past 48 hour(s)). No results found.  VITALS: BP 136/72   Temp (!) 97 F (36.1 C)   Ht 6' (1.829 m)   Wt 217 lb 3.2 oz (98.5 kg)   BMI 29.46 kg/m   EXAM: General appearance: alert and no distress Neck: no carotid bruit and no JVD Lungs: clear to auscultation bilaterally Heart: regular rate and rhythm, S1, S2 normal, no murmur, click, rub or gallop Abdomen: soft, non-tender; bowel sounds normal; no masses,  no organomegaly Extremities: extremities normal, atraumatic, no cyanosis or edema Pulses: 2+ and symmetric Skin: Skin color, texture, turgor normal. No rashes or lesions Neurologic: Grossly normal Psych: Pleasant  EKG: Normal sinus rhythm at 85-personally reviewed  ASSESSMENT: 1. Coronary artery  disease status post PCI to circumflex in 2004 and to the right coronary artery in 2010 with Cypher stents 2. Hypertension 3. Dyslipidemia 4. Psoriasis 5. Erectile dysfunction  PLAN: 1.   Mr. Hustead seems to have a well-controlled dyslipidemia and blood pressure.  He is asymptomatic denying chest pain or shortness of breath.  His EKG does show some new T wave changes inferiorly that were not previously present.  I offered more work-up of this including possible stress testing given his prior history of coronary disease but he declined.  I told him to monitor the symptoms closely and will plan to follow-up in 6 months or sooner as necessary.  Chrystie Nose, MD, [facility], FACP  [place]  [facility]  Medical Director of the Advanced Lipid Disorders &  Cardiovascular Risk Reduction Clinic Diplomate of the American Board of Clinical Lipidology Attending Cardiologist  Direct Dial: [phone]  Fax: [phone]  Website:  [email or web address] 04/14/2019, 1:58 PM

## 2019-04-14 NOTE — Patient Instructions (Signed)
Medication Instructions:  Your physician recommends that you continue on your current medications as directed. Please refer to the Current Medication list given to you today.  *If you need a refill on your cardiac medications before your next appointment, please call your pharmacy*   Lab Work: NONE If you have labs (blood work) drawn today and your tests are completely normal, you will receive your results only by: Marland Kitchen MyChart Message (if you have MyChart) OR . A paper copy in the mail If you have any lab test that is abnormal or we need to change your treatment, we will call you to review the results.   Testing/Procedures: NONE Dr. Rennis Golden recommended a nuclear stress test - please contact our office should you decide to set up this test   Follow-Up: At [facility], you and your health needs are our priority.  As part of our continuing mission to provide you with exceptional heart care, we have created designated Provider Care Teams.  These Care Teams include your primary Cardiologist (physician) and Advanced Practice Providers (APPs -  Physician Assistants and Nurse Practitioners) who all work together to provide you with the care you need, when you need it.  We recommend signing up for the patient portal called "MyChart".  Sign up information is provided on this After Visit Summary.  MyChart is used to connect with patients for Virtual Visits (Telemedicine).  Patients are able to view lab/test results, encounter notes, upcoming appointments, etc.  Non-urgent messages can be sent to your provider as well.   To learn more about what you can do with MyChart, go to [email or web address].    Your next appointment:   6 month(s)  The format for your next appointment:   In Person  Provider:   You may see Dr. Rennis Golden or one of the following Advanced Practice Providers on your designated Care Team:    Azalee Course, PA-C  Micah Flesher, [place] or   Judy Pimple, [place]    Other  Instructions

## 2019-04-17 ENCOUNTER — Encounter: Payer: Self-pay | Admitting: Internal Medicine

## 2019-06-21 ENCOUNTER — Other Ambulatory Visit: Payer: Self-pay | Admitting: Internal Medicine

## 2019-10-24 ENCOUNTER — Telehealth: Payer: Self-pay | Admitting: Internal Medicine

## 2019-10-24 NOTE — Telephone Encounter (Signed)
lvm for patient to return call to get follow up scheduled with Hilty from recall lists 

## 2019-10-25 ENCOUNTER — Other Ambulatory Visit: Payer: Self-pay | Admitting: Internal Medicine

## 2019-11-20 ENCOUNTER — Telehealth (INDEPENDENT_AMBULATORY_CARE_PROVIDER_SITE_OTHER): Payer: Managed Care, Other (non HMO) | Admitting: Physician Assistant

## 2019-11-20 ENCOUNTER — Telehealth: Payer: Self-pay

## 2019-11-20 ENCOUNTER — Encounter: Payer: Self-pay | Admitting: Physician Assistant

## 2019-11-20 VITALS — Ht 72.0 in | Wt 210.0 lb

## 2019-11-20 DIAGNOSIS — E785 Hyperlipidemia, unspecified: Secondary | ICD-10-CM | POA: Diagnosis not present

## 2019-11-20 DIAGNOSIS — I2510 Atherosclerotic heart disease of native coronary artery without angina pectoris: Secondary | ICD-10-CM

## 2019-11-20 DIAGNOSIS — I10 Essential (primary) hypertension: Secondary | ICD-10-CM | POA: Diagnosis not present

## 2019-11-20 MED ORDER — LISINOPRIL 10 MG PO TABS: 10.0000 mg | ORAL_TABLET | Freq: Every day | ORAL | 3 refills | Status: DC

## 2019-11-20 MED ORDER — EZETIMIBE-SIMVASTATIN 10-40 MG PO TABS: 1.0000 | ORAL_TABLET | Freq: Every day | ORAL | 1 refills | Status: DC

## 2019-11-20 NOTE — Progress Notes (Signed)
Virtual Visit via Telephone Note   This visit type was conducted due to national recommendations for restrictions regarding the COVID-19 Pandemic (e.g. social distancing) in an effort to limit this patient's exposure and mitigate transmission in our community.  Due to his co-morbid illnesses, this patient is at least at moderate risk for complications without adequate follow up.  This format is felt to be most appropriate for this patient at this time.  The patient did not have access to video technology/had technical difficulties with video requiring transitioning to audio format only (telephone).  All issues noted in this document were discussed and addressed.  No physical exam could be performed with this format.  Please refer to the patient's chart for his  consent to telehealth for [facility].    Date:  11/20/2019   ID:  Justin Dennis, DOB 03/15/1955, MRN [id number] The patient was identified using 2 identifiers.  Patient Location: Home Provider Location: Office/Clinic  PCP:  Patient, [name] Per  Cardiologist:  Chrystie Nose, MD  Electrophysiologist:  None   Evaluation Performed:  Follow-Up Visit  Chief Complaint:  Follow up  History of Present Illness:    Justin Dennis is a 64 y.o. male with past medical history of hypertension, hyperlipidemia, psoriasis and CAD.  He had late presenting posterior MI in 2004.  A Cypher stent was placed in the left circumflex artery.  He later had another drug-eluting stent placed in the RCA and had a balloon angioplasty to the in-stent restenosis of left circumflex in May 2010.  Last Myoview obtained in November 2014 was low risk without ischemia, EF 60%.  He has requested in the past that either Dr. Excell Seltzer or with Dr. Clifton Lavance to do any stenting if necessary.  He was last seen by Dr. Rennis Golden in March 2021 at which time EKG did showed new T wave inversion inferiorly that was not previously present.  Dr. Rennis Golden offered him a nuclear stress test given  his prior history of CAD, however he declined. Patient presents today for virtual visit.    He denies any recent chest pain or worsening shortness of breath.  He is recovering from a viral illness.  He was seen by a medical provider today who recommended conservative management.  He has not been taking his Vytorin or lisinopril.  He is on aspirin and metoprolol.  I recommended restart of Vytorin and lisinopril.  He will need to follow-up on his blood pressure after restarting the lisinopril to avoid significant drop in the blood pressure.  Otherwise, he is stable to follow-up in 6 months.   The patient does not have symptoms concerning for COVID-19 infection (fever, chills, cough, or new shortness of breath).    Past Medical History:  Diagnosis Date  . CAD (coronary artery disease)   . ED (erectile dysfunction)   . Hyperlipidemia   . Hypertension    Past Surgical History:  Procedure Laterality Date  . CARDIAC CATHETERIZATION  2004   PMI, Cfx Cypher 3.0x72mm DES - Dr. Mervyn Skeeters. [place]   . CARDIAC CATHETERIZATION  06/21/2008   Cypher stent (3.0x47mm) overlapping with XIENCFE 3.0x50mm DES to RCA, in-stent re-stenosis of Cfx (3.55mm cutting balloon arthrectomy) - Dr. Mervyn Skeeters. [place]   . FINGER AMPUTATION     right finger  . NM MYOCAR PERF WALL MOTION  12/2005   bruce myoview; post-stress with normal pattern of perfusion, EF 56%, low risk      Current Meds  Medication Sig  . aspirin  81 MG tablet Take 81 mg by mouth daily. Alternates with 325mg   . Clobetasol Propionate 0.05 % shampoo as needed.  . metoprolol succinate (TOPROL-XL) 25 MG 24 hr tablet TAKE 1 TABLET(25 MG) BY MOUTH DAILY  . Omega-3 Fatty Acids (FISH OIL PO) Take 1 capsule by mouth 2 (two) times daily.     Allergies:   Niaspan [niacin er]   Social History   Tobacco Use  . Smoking status: Former Smoker    Quit date: 01/31/2002    Years since quitting: 17.8  . Smokeless tobacco: Never Used  Substance Use Topics  . Alcohol use: No  .  Drug use: No     Family Hx: The patient's family history includes Diabetes in an other family member; Mitral valve prolapse in his child; Skin cancer in his brother; Stroke in his father; Valvular heart disease in his daughter.  ROS:   Please see the history of present illness.     All other systems reviewed and are negative.   Prior CV studies:   The following studies were reviewed today:  Myoview 12/07/2012 Overall Impression:  Low risk stress nuclear study mild bowel attenuation artifact. No ischemia.  LV Wall Motion:  NL LV Function; NL Wall Motion; EF 60%   Labs/Other Tests and Data Reviewed:    EKG:  An ECG dated 04/19/2019 was personally reviewed today and demonstrated:  Normal sinus rhythm with T wave inversion in the inferior leads.  Recent Labs: No results found for requested labs within last 8760 hours.   Recent Lipid Panel No results found for: CHOL, TRIG, HDL, CHOLHDL, LDLCALC, LDLDIRECT  Wt Readings from Last 3 Encounters:  11/20/19 210 lb (95.3 kg)  04/14/19 217 lb 3.2 oz (98.5 kg)  11/18/17 210 lb (95.3 kg)     Risk Assessment/Calculations:      Objective:    Vital Signs:  Ht 6' (1.829 m)   Wt 210 lb (95.3 kg)   BMI 28.48 kg/m    VITAL SIGNS:  reviewed  ASSESSMENT & PLAN:    1. CAD: On aspirin and Vytorin.  He denies any recent chest pain  2. Hypertension: He has not been taking his lisinopril, we will restart.  3. Hyperlipidemia: Restart Vytorin.  COVID-19 Education: The signs and symptoms of COVID-19 were discussed with the patient and how to seek care for testing (follow up with PCP or arrange E-visit).  The importance of social distancing was discussed today.  Time:   Today, I have spent 8 minutes with the patient with telehealth technology discussing the above problems.     Medication Adjustments/Labs and Tests Ordered: Current medicines are reviewed at length with the patient today.  Concerns regarding medicines are outlined  above.   Tests Ordered: No orders of the defined types were placed in this encounter.   Medication Changes: No orders of the defined types were placed in this encounter.   Follow Up:  In Person in 6 month(s)  Signed, 11/20/17, Azalee Course  11/20/2019 2:29 PM    [facility]

## 2019-11-20 NOTE — Patient Instructions (Signed)
Medication Instructions:   RESTART Vytorin 10-40 mg daily  RESTART Lisinopril 10 mg daily  *If you need a refill on your cardiac medications before your next appointment, please call your pharmacy*  Lab Work: NONE ordered at this time of appointment   If you have labs (blood work) drawn today and your tests are completely normal, you will receive your results only by: Marland Kitchen MyChart Message (if you have MyChart) OR . A paper copy in the mail If you have any lab test that is abnormal or we need to change your treatment, we will call you to review the results.  Testing/Procedures: NONE ordered at this time of appointment   Follow-Up: At [facility], you and your health needs are our priority.  As part of our continuing mission to provide you with exceptional heart care, we have created designated Provider Care Teams.  These Care Teams include your primary Cardiologist (physician) and Advanced Practice Providers (APPs -  Physician Assistants and Nurse Practitioners) who all work together to provide you with the care you need, when you need it.  We recommend signing up for the patient portal called "MyChart".  Sign up information is provided on this After Visit Summary.  MyChart is used to connect with patients for Virtual Visits (Telemedicine).  Patients are able to view lab/test results, encounter notes, upcoming appointments, etc.  Non-urgent messages can be sent to your provider as well.   To learn more about what you can do with MyChart, go to [email or web address].    Your next appointment:   6 month(s)  The format for your next appointment:   In Person  Provider:   K. [place] Hilty, MD  Other Instructions

## 2019-11-20 NOTE — Telephone Encounter (Signed)
Called patient to discuss AVS instructions gave [name]'s recommendations and patient voiced understanding. AVS summary mailed to patient.    

## 2019-11-20 NOTE — Telephone Encounter (Signed)
  Patient Consent for Virtual Visit         Justin Dennis has provided verbal consent on 11/20/2019 for a virtual visit (video or telephone).   CONSENT FOR VIRTUAL VISIT FOR:  Justin Dennis  By participating in this virtual visit I agree to the following:  I hereby voluntarily request, consent and authorize [facility] and its employed or contracted physicians, physician assistants, nurse practitioners or other licensed health care professionals (the Practitioner), to provide me with telemedicine health care services (the "Services") as deemed necessary by the treating Practitioner. I acknowledge and consent to receive the Services by the Practitioner via telemedicine. I understand that the telemedicine visit will involve communicating with the Practitioner through live audiovisual communication technology and the disclosure of certain medical information by electronic transmission. I acknowledge that I have been given the opportunity to request an in-person assessment or other available alternative prior to the telemedicine visit and am voluntarily participating in the telemedicine visit.  I understand that I have the right to withhold or withdraw my consent to the use of telemedicine in the course of my care at any time, without affecting my right to future care or treatment, and that the Practitioner or I may terminate the telemedicine visit at any time. I understand that I have the right to inspect all information obtained and/or recorded in the course of the telemedicine visit and may receive copies of available information for a reasonable fee.  I understand that some of the potential risks of receiving the Services via telemedicine include:  Marland Kitchen Delay or interruption in medical evaluation due to technological equipment failure or disruption; . Information transmitted may not be sufficient (e.g. poor resolution of images) to allow for appropriate medical decision making by the Practitioner;  and/or  . In rare instances, security protocols could fail, causing a breach of personal health information.  Furthermore, I acknowledge that it is my responsibility to provide information about my medical history, conditions and care that is complete and accurate to the best of my ability. I acknowledge that Practitioner's advice, recommendations, and/or decision may be based on factors not within their control, such as incomplete or inaccurate data provided by me or distortions of diagnostic images or specimens that may result from electronic transmissions. I understand that the practice of medicine is not an exact science and that Practitioner makes no warranties or guarantees regarding treatment outcomes. I acknowledge that a copy of this consent can be made available to me via my patient portal [facility] MyChart), or I can request a printed copy by calling the office of [facility].    I understand that my insurance will be billed for this visit.   I have read or had this consent read to me. . I understand the contents of this consent, which adequately explains the benefits and risks of the Services being provided via telemedicine.  . I have been provided ample opportunity to ask questions regarding this consent and the Services and have had my questions answered to my satisfaction. . I give my informed consent for the services to be provided through the use of telemedicine in my medical care

## 2020-09-03 ENCOUNTER — Other Ambulatory Visit: Payer: Self-pay | Admitting: Physician Assistant

## 2020-09-03 ENCOUNTER — Other Ambulatory Visit: Payer: Self-pay | Admitting: Internal Medicine

## 2020-11-01 ENCOUNTER — Other Ambulatory Visit: Payer: Self-pay | Admitting: Internal Medicine

## 2020-11-30 ENCOUNTER — Other Ambulatory Visit: Payer: Self-pay | Admitting: Internal Medicine

## 2021-01-06 ENCOUNTER — Other Ambulatory Visit: Payer: Self-pay | Admitting: Internal Medicine

## 2021-02-07 ENCOUNTER — Other Ambulatory Visit: Payer: Self-pay | Admitting: Internal Medicine

## 2021-03-17 ENCOUNTER — Other Ambulatory Visit: Payer: Self-pay | Admitting: Internal Medicine

## 2021-05-20 ENCOUNTER — Other Ambulatory Visit: Payer: Self-pay | Admitting: Internal Medicine

## 2021-06-18 ENCOUNTER — Other Ambulatory Visit: Payer: Self-pay | Admitting: *Deleted

## 2021-06-18 DIAGNOSIS — Z136 Encounter for screening for cardiovascular disorders: Secondary | ICD-10-CM

## 2021-07-28 ENCOUNTER — Other Ambulatory Visit: Payer: Self-pay | Admitting: Internal Medicine

## 2022-03-26 ENCOUNTER — Ambulatory Visit: Payer: Managed Care, Other (non HMO) | Attending: Nurse Practitioner | Admitting: Nurse Practitioner

## 2022-03-26 ENCOUNTER — Encounter: Payer: Self-pay | Admitting: Nurse Practitioner

## 2022-03-26 VITALS — BP 122/74 | HR 68 | Ht 72.0 in | Wt 221.4 lb

## 2022-03-26 DIAGNOSIS — I2510 Atherosclerotic heart disease of native coronary artery without angina pectoris: Secondary | ICD-10-CM

## 2022-03-26 DIAGNOSIS — E119 Type 2 diabetes mellitus without complications: Secondary | ICD-10-CM | POA: Diagnosis not present

## 2022-03-26 DIAGNOSIS — I2129 ST elevation (STEMI) myocardial infarction involving other sites: Secondary | ICD-10-CM

## 2022-03-26 DIAGNOSIS — I10 Essential (primary) hypertension: Secondary | ICD-10-CM

## 2022-03-26 DIAGNOSIS — E785 Hyperlipidemia, unspecified: Secondary | ICD-10-CM

## 2022-03-26 DIAGNOSIS — N521 Erectile dysfunction due to diseases classified elsewhere: Secondary | ICD-10-CM

## 2022-03-26 MED ORDER — NITROGLYCERIN 0.4 MG SL SUBL: 0.4000 mg | SUBLINGUAL_TABLET | SUBLINGUAL | 3 refills | Status: DC | PRN

## 2022-03-26 MED ORDER — EZETIMIBE-SIMVASTATIN 10-40 MG PO TABS: 1.0000 | ORAL_TABLET | Freq: Every day | ORAL | 3 refills | Status: AC

## 2022-03-26 MED ORDER — METOPROLOL SUCCINATE ER 25 MG PO TB24: ORAL_TABLET | ORAL | 3 refills | Status: DC

## 2022-03-26 NOTE — Patient Instructions (Signed)
Medication Instructions:  Stop Lisinopril as directed  *If you need a refill on your cardiac medications before your next appointment, please call your pharmacy*   Lab Work: NONE ordered at this time of appointment   If you have labs (blood work) drawn today and your tests are completely normal, you will receive your results only by: [place] (if you have MyChart) OR A paper copy in the mail If you have any lab test that is abnormal or we need to change your treatment, we will call you to review the results.   Testing/Procedures: NONE ordered at this time of appointment    Follow-Up: At [facility], you and your health needs are our priority.  As part of our continuing mission to provide you with exceptional heart care, we have created designated Provider Care Teams.  These Care Teams include your primary Cardiologist (physician) and Advanced Practice Providers (APPs -  Physician Assistants and Nurse Practitioners) who all work together to provide you with the care you need, when you need it.  We recommend signing up for the patient portal called "MyChart".  Sign up information is provided on this After Visit Summary.  MyChart is used to connect with patients for Virtual Visits (Telemedicine).  Patients are able to view lab/test results, encounter notes, upcoming appointments, etc.  Non-urgent messages can be sent to your provider as well.   To learn more about what you can do with MyChart, go to [email or web address].    Your next appointment:   1 year(s)  Provider:   Pixie Casino, MD     Other Instructions Monitor blood pressure. Report consistently greater than 130/80.

## 2022-03-26 NOTE — Progress Notes (Signed)
Office Visit    Patient Name: Justin Dennis Date of Encounter: 03/26/2022  Primary Care Provider:  Patient, [name] Per Primary Cardiologist:  Pixie Casino, MD  Chief Complaint    67 year old male with a history of CAD s/p DES-LCx in 2004, DES-RCA, PTCA-LCx (ISR) in 2010, hypertension, hyperlipidemia, type 2 diabetes and psoriasis who presents for follow-up related to CAD.  Past Medical History    Past Medical History:  Diagnosis Date   CAD (coronary artery disease)    ED (erectile dysfunction)    Hyperlipidemia    Hypertension    Past Surgical History:  Procedure Laterality Date   CARDIAC CATHETERIZATION  2004   PMI, Cfx Cypher 3.0x22m DES - Dr. ARockne Menghini   CARDIAC CATHETERIZATION  06/21/2008   Cypher stent (3.0x160m overlapping with XIENCFE 3.0x1576mES to RCA, in-stent re-stenosis of Cfx (3.5mm58mtting balloon arthrectomy) - Dr. A. LRockne MenghiniFINGER AMPUTATION     right finger   NM MYOCAR PERF WALL MOTION  12/2005   bruce myoview; post-stress with normal pattern of perfusion, EF 56%, low risk     Allergies  Allergies  Allergen Reactions   Niaspan [Niacin Er]     Severe hot flashes     Labs/Other Studies Reviewed    The following studies were reviewed today: Myoview 12/07/2012 Overall Impression:  Low risk stress nuclear study mild bowel attenuation artifact. No ischemia.   LV Wall Motion:  NL LV Function; NL Wall Motion; EF 60%    Recent Labs: No results found for requested labs within last 365 days.  Recent Lipid Panel No results found for: "CHOL", "TRIG", "HDL", "CHOLHDL", "VLDL", "LDLCALC", "LDLDIRECT"  History of Present Illness    66 y13r old male with the above past medical history including CAD s/p DES-LCx in 2004, DES-RCA, PTCA-LCx (ISR) in 2010, hypertension, hyperlipidemia, type 2 diabetes and psoriasis.  He was hospitalized in 2004 in the setting of late presenting posterior MI and underwent stenting to the left circumflex artery.  He had  another DES placed to the RCA and PTCA to the left circumflex for ISR in May 2010.  Myoview in 2014 was low risk with no evidence of ischemia, EF 60%.  He was last seen virtually on 11/20/2019 and was stable from a cardiac standpoint.  He denies symptoms concerning for angina.  He reported having stopped taking his Vytorin and lisinopril.  He was advised to restart these medications and follow-up in 6 months.  He has not been seen in follow-up since.  He presents today for follow-up.  Since his last visit done well from a cardiac standpoint.  He states he has not taken his lisinopril in over a year.  BP has been well-controlled. He denies symptoms concerning for angina.  He continues to work.  Overall, he reports feeling well.   Home Medications    Current Outpatient Medications  Medication Sig Dispense Refill   aspirin 81 MG tablet Take 81 mg by mouth daily. Alternates with '325mg'$      betamethasone dipropionate (DIPROLENE) 0.05 % ointment as needed.     Clobetasol Propionate 0.05 % shampoo as needed.     ergocalciferol (VITAMIN D2) 1.25 MG (50000 UT) capsule ergocalciferol (vitamin D2) 1,250 mcg (50,000 unit) capsule     HUMIRA PEN 40 MG/0.8ML injection every 14 (fourteen) days.     Multiple Vitamin (MULTIVITAMIN) capsule Take 1 capsule by mouth daily.     Omega-3 Fatty Acids (FISH OIL PO) Take 1 capsule  by mouth 2 (two) times daily.     sildenafil (REVATIO) 20 MG tablet Take 1 tablet (20 mg total) by mouth as needed. 50 tablet 0   ezetimibe-simvastatin (VYTORIN) 10-40 MG tablet Take 1 tablet by mouth daily. 90 tablet 3   metoprolol succinate (TOPROL-XL) 25 MG 24 hr tablet TAKE 1 TABLET(25 MG) BY MOUTH DAILY 90 tablet 3   nitroGLYCERIN (NITROSTAT) 0.4 MG SL tablet Place 1 tablet (0.4 mg total) under the tongue every 5 (five) minutes as needed for chest pain. 25 tablet 3   No current facility-administered medications for this visit.     Review of Systems    He denies chest pain, palpitations,  dyspnea, pnd, orthopnea, n, v, dizziness, syncope, edema, weight gain, or early satiety. All other systems reviewed and are otherwise negative except as noted above.   Physical Exam    VS:  BP 122/74   Pulse 68   Ht 6' (1.829 m)   Wt 221 lb 6.4 oz (100.4 kg)   SpO2 97%   BMI 30.03 kg/m   GEN: Well nourished, well developed, in no acute distress. HEENT: normal. Neck: Supple, no JVD, carotid bruits, or masses. Cardiac: RRR, no murmurs, rubs, or gallops. No clubbing, cyanosis, edema.  Radials/DP/PT 2+ and equal bilaterally.  Respiratory:  Respirations regular and unlabored, clear to auscultation bilaterally. GI: Soft, nontender, nondistended, BS + x 4. MS: no deformity or atrophy. Skin: warm and dry, no rash. Neuro:  Strength and sensation are intact. Psych: Normal affect.  Accessory Clinical Findings    ECG personally reviewed by me today -sinus rhythm, 68 bpm - no acute changes.   Lab Results  Component Value Date   WBC 9.4 06/22/2008   HGB 15.1 06/22/2008   HCT 43.9 06/22/2008   MCV 86.1 06/22/2008   PLT 268 06/22/2008   Lab Results  Component Value Date   CREATININE 0.94 06/22/2008   BUN 11 06/22/2008   NA 139 06/22/2008   K 4.2 06/22/2008   CL 107 06/22/2008   CO2 29 06/22/2008   No results found for: "ALT", "AST", "GGT", "ALKPHOS", "BILITOT" No results found for: "CHOL", "HDL", "LDLCALC", "LDLDIRECT", "TRIG", "CHOLHDL"  No results found for: "HGBA1C"  Assessment & Plan   1. CAD: S/p DES-LCx in 2004, DES-RCA, PTCA-LCx (ISR) in 2010. Stable with no anginal symptoms. No indication for ischemic evaluation.  Continue aspirin, metoprolol, Vytorin.  2. Hypertension: BP well controlled.  He has not taken lisinopril in over a year.  Will discontinue for now.  If BP consistently elevated above 130/80, consider reintroduction of lisinopril (previously on 10 mg daily).  For now, continue current antihypertensive regimen.   3. Hyperlipidemia: LDL was 40 in 05/2021.   Monitored per PCP.  Continue Vytorin.  4. Type 2 diabetes: A1c was 5.9 in 05/2021.  Monitored and managed per PCP.  He is now on metformin.  5. Disposition: Follow-up in 1 year.     Lenna Sciara, NP 03/26/2022, 5:30 PM

## 2022-07-02 ENCOUNTER — Other Ambulatory Visit: Payer: Self-pay | Admitting: Nurse Practitioner

## 2022-07-02 DIAGNOSIS — N521 Erectile dysfunction due to diseases classified elsewhere: Secondary | ICD-10-CM

## 2022-07-02 DIAGNOSIS — E785 Hyperlipidemia, unspecified: Secondary | ICD-10-CM

## 2022-07-02 DIAGNOSIS — I2129 ST elevation (STEMI) myocardial infarction involving other sites: Secondary | ICD-10-CM

## 2022-07-02 DIAGNOSIS — I2510 Atherosclerotic heart disease of native coronary artery without angina pectoris: Secondary | ICD-10-CM

## 2022-07-02 DIAGNOSIS — I10 Essential (primary) hypertension: Secondary | ICD-10-CM

## 2023-03-18 ENCOUNTER — Encounter: Payer: Self-pay | Admitting: Nurse Practitioner

## 2023-03-18 ENCOUNTER — Ambulatory Visit: Payer: Managed Care, Other (non HMO) | Attending: Nurse Practitioner | Admitting: Nurse Practitioner

## 2023-03-18 VITALS — BP 120/70 | HR 67 | Ht 72.0 in | Wt 220.0 lb

## 2023-03-18 DIAGNOSIS — E785 Hyperlipidemia, unspecified: Secondary | ICD-10-CM | POA: Diagnosis not present

## 2023-03-18 DIAGNOSIS — E119 Type 2 diabetes mellitus without complications: Secondary | ICD-10-CM

## 2023-03-18 DIAGNOSIS — I25118 Atherosclerotic heart disease of native coronary artery with other forms of angina pectoris: Secondary | ICD-10-CM | POA: Diagnosis not present

## 2023-03-18 DIAGNOSIS — I10 Essential (primary) hypertension: Secondary | ICD-10-CM

## 2023-03-18 NOTE — Progress Notes (Signed)
Office Visit    Patient Name: Justin Dennis Date of Encounter: 03/18/2023  Primary Care Provider:  Patient, [name] Per Primary Cardiologist:  Chrystie Nose, MD  Chief Complaint    68 year old male with a history of CAD s/p DES-LCx in 2004, DES-RCA, PTCA-LCx (ISR) in 2010, hypertension, hyperlipidemia, type 2 diabetes and psoriasis who presents for follow-up related to CAD.   Past Medical History    Past Medical History:  Diagnosis Date   CAD (coronary artery disease)    ED (erectile dysfunction)    Hyperlipidemia    Hypertension    Past Surgical History:  Procedure Laterality Date   CARDIAC CATHETERIZATION  2004   PMI, Cfx Cypher 3.0x1mm DES - Dr. Langston Reusing    CARDIAC CATHETERIZATION  06/21/2008   Cypher stent (3.0x89mm) overlapping with XIENCFE 3.0x24mm DES to RCA, in-stent re-stenosis of Cfx (3.73mm cutting balloon arthrectomy) - Dr. Langston Reusing    FINGER AMPUTATION     right finger   NM MYOCAR PERF WALL MOTION  12/2005   bruce myoview; post-stress with normal pattern of perfusion, EF 56%, low risk     Allergies  Allergies  Allergen Reactions   Niaspan [Niacin Er (Antihyperlipidemic)]     Severe hot flashes     Labs/Other Studies Reviewed    The following studies were reviewed today:     Recent Labs: No results found for requested labs within last 365 days.  Recent Lipid Panel No results found for: "CHOL", "TRIG", "HDL", "CHOLHDL", "VLDL", "LDLCALC", "LDLDIRECT"  History of Present Illness    68 year old male with the above past medical history including CAD s/p DES-LCx in 2004, DES-RCA, PTCA-LCx (ISR) in 2010, hypertension, hyperlipidemia, type 2 diabetes and psoriasis.   He was hospitalized in 2004 in the setting of late presenting posterior MI and underwent stenting to the left circumflex artery.  He had another DES placed to the RCA and PTCA to the left circumflex for ISR in May 2010.  Myoview in 2014 was low risk with no evidence of ischemia, EF 60%.   He was last seen in the office on 03/26/2022 and was stable from a cardiac standpoint. He denied symptoms concerning for angina.     He presents today for follow-up.  Since his last visit he has done well from a cardiac standpoint.  Over the past 2 months he has noted intermittent back pain, located between his shoulder blades, similar to prior anginal equivalent.  His symptoms will last for minutes at a time and resolve spontaneously, most noticeable with exertion.  Additionally, he has noted symptoms of acid reflux. He denies any chest pain or shortness of breath though does note overall decreased "stamina."  He denies palpitations, dizziness, edema, PND, orthopnea, weight gain.  He is concerned that his symptoms are similar to the symptoms he experienced prior to his last stent/PTCA.  Home Medications    Current Outpatient Medications  Medication Sig Dispense Refill   aspirin 81 MG tablet Take 81 mg by mouth daily. Alternates with 325mg      betamethasone dipropionate (DIPROLENE) 0.05 % ointment as needed.     Clobetasol Propionate 0.05 % shampoo as needed.     ezetimibe-simvastatin (VYTORIN) 10-40 MG tablet Take 1 tablet by mouth daily. 90 tablet 3   HUMIRA PEN 40 MG/0.8ML injection every 14 (fourteen) days.     metoprolol succinate (TOPROL-XL) 25 MG 24 hr tablet TAKE 1 TABLET(25 MG) BY MOUTH DAILY 90 tablet 3   Multiple Vitamin (MULTIVITAMIN)  capsule Take 1 capsule by mouth daily.     nitroGLYCERIN (NITROSTAT) 0.4 MG SL tablet DISSOLVE 1 TABLET UNDER THE TONGUE EVERY 5 MINUTES AS NEEDED FOR CHEST PAIN 90 tablet 3   Omega-3 Fatty Acids (FISH OIL PO) Take 1 capsule by mouth 2 (two) times daily.     sildenafil (REVATIO) 20 MG tablet Take 1 tablet (20 mg total) by mouth as needed. 50 tablet 0   ergocalciferol (VITAMIN D2) 1.25 MG (50000 UT) capsule ergocalciferol (vitamin D2) 1,250 mcg (50,000 unit) capsule (Patient not taking: Reported on 03/18/2023)     No current facility-administered  medications for this visit.     Review of Systems   He denies chest pain, palpitations, dyspnea, pnd, orthopnea, n, v, dizziness, syncope, edema, weight gain, or early satiety. All other systems reviewed and are otherwise negative except as noted above.   Physical Exam    VS:  BP 120/70   Pulse 67   Ht 6' (1.829 m)   Wt 220 lb (99.8 kg)   SpO2 98%   BMI 29.84 kg/m   GEN: Well nourished, well developed, in no acute distress. HEENT: normal. Neck: Supple, no JVD, carotid bruits, or masses. Cardiac: RRR, no murmurs, rubs, or gallops. No clubbing, cyanosis, edema.  Radials/DP/PT 2+ and equal bilaterally.  Respiratory:  Respirations regular and unlabored, clear to auscultation bilaterally. GI: Soft, nontender, nondistended, BS + x 4. MS: no deformity or atrophy. Skin: warm and dry, no rash. Neuro:  Strength and sensation are intact. Psych: Normal affect.  Accessory Clinical Findings    ECG personally reviewed by me today - EKG Interpretation Date/Time:  Thursday March 18 2023 14:06:52 EST Ventricular Rate:  67 PR Interval:  198 QRS Duration:  108 QT Interval:  400 QTC Calculation: 422 R Axis:   -38  Text Interpretation: Normal sinus rhythm Possible Left atrial enlargement Left axis deviation Minimal voltage criteria for LVH, may be normal variant ( Cornell product ) When compared with ECG of 22-Jun-2008 03:38, QRS duration has increased Confirmed by Bernadene Person ([id number]) on 03/18/2023 2:15:31 PM  - no acute changes.   Lab Results  Component Value Date   WBC 9.4 06/22/2008   HGB 15.1 06/22/2008   HCT 43.9 06/22/2008   MCV 86.1 06/22/2008   PLT 268 06/22/2008   Lab Results  Component Value Date   CREATININE 0.94 06/22/2008   BUN 11 06/22/2008   NA 139 06/22/2008   K 4.2 06/22/2008   CL 107 06/22/2008   CO2 29 06/22/2008   No results found for: "ALT", "AST", "GGT", "ALKPHOS", "BILITOT" No results found for: "CHOL", "HDL", "LDLCALC", "LDLDIRECT", "TRIG", "CHOLHDL"   No results found for: "HGBA1C"  Assessment & Plan    1. CAD: S/p DES-LCx in 2004, DES-RCA, PTCA-LCx (ISR) in 2010.  Myoview in 2014 was low risk with no evidence of ischemia, EF 60%.  He notes a 11-month history of intermittent back pain, between his shoulder blades, that will last for minutes at a time, mostly with exertion.  He also notes symptoms of acid reflux.  Symptoms are similar to prior anginal equivalent.  He also notes decreased stamina.  He denies chest pain, dyspnea.  He is not a treadmill candidate in the setting of overall decreased activity tolerance, is unlikely to reach target heart rate. Given history of CAD, recent back pain, similar to prior anginal equivalent, will pursue cardiac PET stress test to rule out ischemia.  Reviewed ED precautions. Continue aspirin, metoprolol, Vytorin.  Informed  Consent   Shared Decision Making/Informed Consent The risks [chest pain, shortness of breath, cardiac arrhythmias, dizziness, blood pressure fluctuations, myocardial infarction, stroke/transient ischemic attack, nausea, vomiting, allergic reaction, radiation exposure, metallic taste sensation and life-threatening complications (estimated to be 1 in 10,000)], benefits (risk stratification, diagnosing coronary artery disease, treatment guidance) and alternatives of a cardiac PET stress test were discussed in detail with Mr. Vicario and he agrees to proceed.     2. Hypertension: BP well controlled. Continue current antihypertensive regimen.    3. Hyperlipidemia: LDL was 40 in 05/2021.  He notes he will have labs with his PCP next month.  I have requested that they send [place] a copy.   Continue Vytorin.   4. Type 2 diabetes: A1c was 5.9 in 05/2021.  Monitored and managed per PCP.     5. Disposition: Follow-up in 6-8 weeks.      Joylene Grapes, NP 03/18/2023, 2:15 PM

## 2023-03-18 NOTE — Patient Instructions (Signed)
Medication Instructions:  Your physician recommends that you continue on your current medications as directed. Please refer to the Current Medication list given to you today.  *If you need a refill on your cardiac medications before your next appointment, please call your pharmacy*   Lab Work: NONE ordered at this time of appointment   Testing/Procedures:    Please report to Radiology at the [facility] Main Entrance 30 minutes early for your test.  [address] [place], [place] [address]                         OR   Please report to Radiology at [facility] Main Entrance, medical mall, 30 mins prior to your test.  [address]  [place], [place]  How to Prepare for Your Cardiac PET/CT Stress Test:  Nothing to eat or drink, except water, 3 hours prior to arrival time.  NO caffeine/decaffeinated products, or chocolate 12 hours prior to arrival. (Please note decaffeinated beverages (teas/coffees) still contain caffeine).  If you have caffeine within 12 hours prior, the test will need to be rescheduled.  Medication instructions: Do not take erectile dysfunction medications for 72 hours prior to test (sildenafil, tadalafil) Do not take nitrates (isosorbide mononitrate, Ranexa) the day before or day of test Do not take tamsulosin the day before or morning of test Hold theophylline containing medications for 12 hours. Hold Dipyridamole 48 hours prior to the test.  Diabetic Preparation: If able to eat breakfast prior to 3 hour fasting, you may take all medications, including your insulin. Do not worry if you miss your breakfast dose of insulin - start at your next meal. If you do not eat prior to 3 hour fast-Hold all diabetes (oral and insulin) medications. Patients who wear a continuous glucose monitor MUST remove the device prior to scanning.  You may take your remaining medications with water.  NO perfume, cologne or lotion on chest or  abdomen area. FEMALES - Please avoid wearing dresses to this appointment.  Total time is 1 to 2 hours; you may want to bring reading material for the waiting time.  IF YOU THINK YOU MAY BE PREGNANT, OR ARE NURSING PLEASE INFORM THE TECHNOLOGIST.  In preparation for your appointment, medication and supplies will be purchased.  Appointment availability is limited, so if you need to cancel or reschedule, please call the Radiology Department Scheduler at [phone] 24 hours in advance to avoid a cancellation fee of $100.00  What to Expect When you Arrive:  Once you arrive and check in for your appointment, you will be taken to a preparation room within the Radiology Department.  A technologist or Nurse will obtain your medical history, verify that you are correctly prepped for the exam, and explain the procedure.  Afterwards, an IV will be started in your arm and electrodes will be placed on your skin for EKG monitoring during the stress portion of the exam. Then you will be escorted to the PET/CT scanner.  There, staff will get you positioned on the scanner and obtain a blood pressure and EKG.  During the exam, you will continue to be connected to the EKG and blood pressure machines.  A small, safe amount of a radioactive tracer will be injected in your IV to obtain a series of pictures of your heart along with an injection of a stress agent.    After your Exam:  It is recommended that you  eat a meal and drink a caffeinated beverage to counter act any effects of the stress agent.  Drink plenty of fluids for the remainder of the day and urinate frequently for the first couple of hours after the exam.  Your doctor will inform you of your test results within 7-10 business days.  For more information and frequently asked questions, please visit our website: [email or web address]  For questions about your test or how to prepare for your test, please call: Cardiac Imaging Nurse  Navigators Office: [phone]   Follow-Up: At [facility], you and your health needs are our priority.  As part of our continuing mission to provide you with exceptional heart care, we have created designated Provider Care Teams.  These Care Teams include your primary Cardiologist (physician) and Advanced Practice Providers (APPs -  Physician Assistants and Nurse Practitioners) who all work together to provide you with the care you need, when you need it.  We recommend signing up for the patient portal called "MyChart".  Sign up information is provided on this After Visit Summary.  MyChart is used to connect with patients for Virtual Visits (Telemedicine).  Patients are able to view lab/test results, encounter notes, upcoming appointments, etc.  Non-urgent messages can be sent to your provider as well.   To learn more about what you can do with MyChart, go to [email or web address].    Your next appointment:   6-8 week(s)  Provider:   Bernadene Person, NP

## 2023-04-04 ENCOUNTER — Other Ambulatory Visit: Payer: Self-pay | Admitting: Nurse Practitioner

## 2023-04-29 ENCOUNTER — Ambulatory Visit: Payer: Managed Care, Other (non HMO) | Admitting: Nurse Practitioner

## 2023-06-02 ENCOUNTER — Ambulatory Visit (HOSPITAL_COMMUNITY): Payer: Managed Care, Other (non HMO)

## 2023-06-07 ENCOUNTER — Ambulatory Visit: Payer: Managed Care, Other (non HMO) | Admitting: Nurse Practitioner
